# Patient Record
Sex: Female | Born: 1950 | Race: Black or African American | Hispanic: No | Marital: Single | Smoking: Never smoker
Health system: Southern US, Community
[De-identification: ages and names within clinical notes are randomized; demographics above are authoritative.]

## PROBLEM LIST (undated history)

## (undated) DIAGNOSIS — I10 Essential (primary) hypertension: Secondary | ICD-10-CM

---

## 1999-08-07 ENCOUNTER — Encounter: Payer: Self-pay | Admitting: Emergency Medicine

## 1999-08-07 ENCOUNTER — Emergency Department (HOSPITAL_COMMUNITY): Admission: EM | Admit: 1999-08-07 | Discharge: 1999-08-07 | Payer: Self-pay | Admitting: Emergency Medicine

## 1999-11-07 ENCOUNTER — Emergency Department (HOSPITAL_COMMUNITY): Admission: EM | Admit: 1999-11-07 | Discharge: 1999-11-07 | Payer: Self-pay | Admitting: Emergency Medicine

## 2007-03-13 ENCOUNTER — Emergency Department (HOSPITAL_COMMUNITY): Admission: EM | Admit: 2007-03-13 | Discharge: 2007-03-13 | Payer: Self-pay | Admitting: Emergency Medicine

## 2008-10-08 ENCOUNTER — Emergency Department (HOSPITAL_COMMUNITY): Admission: EM | Admit: 2008-10-08 | Discharge: 2008-10-08 | Payer: Self-pay | Admitting: Emergency Medicine

## 2008-10-11 ENCOUNTER — Emergency Department (HOSPITAL_COMMUNITY): Admission: EM | Admit: 2008-10-11 | Discharge: 2008-10-11 | Payer: Self-pay | Admitting: Emergency Medicine

## 2009-07-05 ENCOUNTER — Emergency Department (HOSPITAL_COMMUNITY): Admission: EM | Admit: 2009-07-05 | Discharge: 2009-07-05 | Payer: Self-pay | Admitting: Emergency Medicine

## 2011-07-24 LAB — URINALYSIS, ROUTINE W REFLEX MICROSCOPIC
Bilirubin Urine: NEGATIVE
Glucose, UA: NEGATIVE mg/dL
Hgb urine dipstick: NEGATIVE
Ketones, ur: NEGATIVE mg/dL
Nitrite: NEGATIVE
Protein, ur: NEGATIVE mg/dL
Specific Gravity, Urine: 1.021 (ref 1.005–1.030)
Urobilinogen, UA: 1 mg/dL (ref 0.0–1.0)
pH: 7.5 (ref 5.0–8.0)

## 2011-07-24 LAB — URINE MICROSCOPIC-ADD ON

## 2011-07-24 LAB — DIFFERENTIAL
Eosinophils Absolute: 0 10*3/uL (ref 0.0–0.7)
Eosinophils Relative: 2 % (ref 0–5)
Lymphocytes Relative: 28 % (ref 12–46)
Lymphs Abs: 0.7 10*3/uL (ref 0.7–4.0)
Lymphs Abs: 1.5 10*3/uL (ref 0.7–4.0)
Monocytes Absolute: 0.3 10*3/uL (ref 0.1–1.0)
Monocytes Relative: 4 % (ref 3–12)
Neutrophils Relative %: 61 % (ref 43–77)
Neutrophils Relative %: 87 % — ABNORMAL HIGH (ref 43–77)

## 2011-07-24 LAB — BASIC METABOLIC PANEL
BUN: 8 mg/dL (ref 6–23)
CO2: 24 mEq/L (ref 19–32)
Calcium: 9.1 mg/dL (ref 8.4–10.5)
Chloride: 104 mEq/L (ref 96–112)
Creatinine, Ser: 0.99 mg/dL (ref 0.4–1.2)
GFR calc Af Amer: >60 mL/min (ref >60)
GFR calc non Af Amer: 58 mL/min — ABNORMAL LOW (ref >60)
Glucose, Bld: 118 mg/dL — ABNORMAL HIGH (ref 70–99)
Potassium: 3.4 mEq/L — ABNORMAL LOW (ref 3.5–5.1)
Sodium: 138 mEq/L (ref 135–145)

## 2011-07-24 LAB — POCT I-STAT, CHEM 8
BUN: 10 mg/dL (ref 6–23)
Creatinine, Ser: 1 mg/dL (ref 0.4–1.2)
Potassium: 2.9 mEq/L — ABNORMAL LOW (ref 3.5–5.1)
Sodium: 143 mEq/L (ref 135–145)

## 2011-07-24 LAB — CBC
HCT: 36.1 % (ref 36.0–46.0)
HCT: 38.5 % (ref 36.0–46.0)
Hemoglobin: 12.8 g/dL (ref 12.0–15.0)
MCHC: 33.2 g/dL (ref 30.0–36.0)
MCV: 84.1 fL (ref 78.0–100.0)
Platelets: 212 10*3/uL (ref 150–400)
Platelets: 214 10*3/uL (ref 150–400)
RBC: 4.33 MIL/uL (ref 3.87–5.11)
RBC: 4.58 MIL/uL (ref 3.87–5.11)
RDW: 14 % (ref 11.5–15.5)
WBC: 5.2 10*3/uL (ref 4.0–10.5)
WBC: 8.2 10*3/uL (ref 4.0–10.5)

## 2011-07-24 LAB — URINE CULTURE

## 2011-10-15 ENCOUNTER — Emergency Department (HOSPITAL_COMMUNITY)
Admission: EM | Admit: 2011-10-15 | Discharge: 2011-10-15 | Disposition: A | Discharge status: Home / Self Care | Payer: Self-pay | Attending: Emergency Medicine | Admitting: Emergency Medicine

## 2011-10-15 ENCOUNTER — Emergency Department (HOSPITAL_COMMUNITY): Payer: Self-pay

## 2011-10-15 ENCOUNTER — Encounter: Payer: Self-pay | Admitting: *Deleted

## 2011-10-15 DIAGNOSIS — H8110 Benign paroxysmal vertigo, unspecified ear: Secondary | ICD-10-CM | POA: Insufficient documentation

## 2011-10-15 DIAGNOSIS — R42 Dizziness and giddiness: Secondary | ICD-10-CM | POA: Insufficient documentation

## 2011-10-15 DIAGNOSIS — F411 Generalized anxiety disorder: Secondary | ICD-10-CM | POA: Insufficient documentation

## 2011-10-15 DIAGNOSIS — R112 Nausea with vomiting, unspecified: Secondary | ICD-10-CM | POA: Insufficient documentation

## 2011-10-15 DIAGNOSIS — J3489 Other specified disorders of nose and nasal sinuses: Secondary | ICD-10-CM | POA: Insufficient documentation

## 2011-10-15 HISTORY — DX: Essential (primary) hypertension: I10

## 2011-10-15 LAB — CBC
Hemoglobin: 13.6 g/dL (ref 12.0–15.0)
MCH: 28.5 pg (ref 26.0–34.0)
MCV: 83 fL (ref 78.0–100.0)
RBC: 4.77 MIL/uL (ref 3.87–5.11)

## 2011-10-15 LAB — DIFFERENTIAL
Eosinophils Absolute: 0 10*3/uL (ref 0.0–0.7)
Eosinophils Relative: 1 % (ref 0–5)
Lymphs Abs: 2.1 10*3/uL (ref 0.7–4.0)
Monocytes Relative: 6 % (ref 3–12)

## 2011-10-15 LAB — URINALYSIS, ROUTINE W REFLEX MICROSCOPIC
Nitrite: NEGATIVE
Urobilinogen, UA: 0.2 mg/dL (ref 0.0–1.0)
pH: 8 (ref 5.0–8.0)

## 2011-10-15 LAB — COMPREHENSIVE METABOLIC PANEL
Alkaline Phosphatase: 79 U/L (ref 39–117)
BUN: 13 mg/dL (ref 6–23)
Calcium: 10.2 mg/dL (ref 8.4–10.5)
GFR calc Af Amer: 83 mL/min — ABNORMAL LOW (ref >90)
Glucose, Bld: 116 mg/dL — ABNORMAL HIGH (ref 70–99)
Total Protein: 8.6 g/dL — ABNORMAL HIGH (ref 6.0–8.3)

## 2011-10-15 MED ORDER — MECLIZINE HCL 25 MG PO TABS
25.0000 mg | ORAL_TABLET | Freq: Once | ORAL | Status: AC
  Administered 2011-10-15: 25 mg via ORAL
  Filled 2011-10-15: qty 1

## 2011-10-15 MED ORDER — MECLIZINE HCL 25 MG PO TABS: 25.0000 mg | ORAL_TABLET | Freq: Four times a day (QID) | ORAL | Status: AC

## 2011-10-15 MED ORDER — OXYMETAZOLINE HCL 0.05 % NA SOLN: 2.0000 | Freq: Two times a day (BID) | NASAL | Status: AC

## 2011-10-15 MED ORDER — SODIUM CHLORIDE 0.9 % IV BOLUS (SEPSIS)
1000.0000 mL | Freq: Once | INTRAVENOUS | Status: AC
  Administered 2011-10-15: 1000 mL via INTRAVENOUS

## 2011-10-15 NOTE — ED Provider Notes (Signed)
History     CSN: [id number]  Arrival date & time 10/15/11  1613   First MD Initiated Contact with Patient 10/15/11 1700      Chief Complaint  Patient presents with  . Influenza    (Consider location/radiation/quality/duration/timing/severity/associated sxs/prior treatment) HPI History is obtained from the patient. States she has had flulike symptoms for several days approx 10 days ago. She has not had a fever at home. States that her flulike symptoms have improved, but she has had dizziness, nausea, and emesis for the past several days. She has been unable to tolerate PO due to nausea.  She is unable to describe the sensation of dizziness well, but thinks it feels most consistent with a sensation of the room spinning. She has had no associated ear pain or tinnitus.  She notes that the dizziness does seem to get worse with certain position changes. She's never had anything like this before.  Past Medical History  Diagnosis Date  . Hypertension     History reviewed. No pertinent past surgical history.  No family history on file.  History  Substance Use Topics  . Smoking status: Never Smoker   . Smokeless tobacco: Not on file  . Alcohol Use: No    OB History    Grav Para Term Preterm Abortions TAB SAB Ect Mult Living                  Review of Systems  Constitutional: Positive for appetite change. Negative for fever, chills, activity change, fatigue and unexpected weight change.  HENT: Positive for congestion. Negative for hearing loss, ear pain, sore throat, trouble swallowing, neck pain, neck stiffness, voice change, sinus pressure and tinnitus.   Eyes: Negative for pain, discharge, redness and visual disturbance.  Respiratory: Negative for cough, chest tightness, shortness of breath and wheezing.   Cardiovascular: Negative for chest pain and palpitations.  Gastrointestinal: Positive for nausea and vomiting. Negative for abdominal pain, diarrhea and constipation.    Genitourinary: Negative for dysuria and decreased urine volume.  Musculoskeletal: Negative for myalgias.  Skin: Negative for color change and rash.  Neurological: Positive for dizziness. Negative for syncope, weakness, light-headedness, numbness and headaches.    Allergies  Review of patient's allergies indicates no known allergies.  Home Medications   Current Outpatient Rx  Name Route Sig Dispense Refill  . IBUPROFEN 200 MG PO TABS Oral Take 400 mg by mouth every 6 (six) hours as needed. pain       BP 154/101  Pulse 106  Temp(Src) 97.6 F (36.4 C) (Oral)  Resp 24  SpO2 100%  Physical Exam  Nursing note and vitals reviewed. Constitutional: She is oriented to person, place, and time. She appears well-developed and well-nourished. No distress.       Uncomfortable appearing  HENT:  Head: Normocephalic and atraumatic.  Right Ear: Tympanic membrane, external ear and ear canal normal.  Left Ear: Tympanic membrane, external ear and ear canal normal.  Nose: Nose normal.  Mouth/Throat: Oropharynx is clear and moist. No oropharyngeal exudate.       No definitive nystagmus noted on Hallpike testing, although patient's symptoms were replicated when her head is tilted to the right.  Eyes: Conjunctivae and EOM are normal. Pupils are equal, round, and reactive to light.  Neck: Normal range of motion. Neck supple.  Cardiovascular: Normal rate, regular rhythm and normal heart sounds.   Pulmonary/Chest: Effort normal and breath sounds normal. No respiratory distress. She has no wheezes.  Abdominal: Soft. Bowel  sounds are normal. There is no tenderness. There is no guarding.  Musculoskeletal: Normal range of motion.  Lymphadenopathy:    She has no cervical adenopathy.  Neurological: She is alert and oriented to person, place, and time. No cranial nerve deficit. Coordination normal.  Skin: Skin is warm and dry. No rash noted. She is not diaphoretic.  Psychiatric: Her speech is normal. Her  mood appears anxious.    ED Course  Procedures (including critical care time)  Labs Reviewed  COMPREHENSIVE METABOLIC PANEL - Abnormal; Notable for the following:    Glucose, Bld 116 (*)    Total Protein 8.6 (*)    ALT 43 (*)    GFR calc non Af Amer 72 (*)    GFR calc Af Amer 83 (*)    All other components within normal limits  URINALYSIS, ROUTINE W REFLEX MICROSCOPIC - Abnormal; Notable for the following:    Ketones, ur TRACE (*)    Leukocytes, UA SMALL (*)    All other components within normal limits  CBC  DIFFERENTIAL  URINE MICROSCOPIC-ADD ON   Ct Head Wo Contrast  10/15/2011  *RADIOLOGY REPORT*  Clinical Data: Dizziness for 10 days  CT HEAD WITHOUT CONTRAST  Technique:  Contiguous axial images were obtained from the base of the skull through the vertex without contrast.  Comparison: None  Findings: The brain has a normal appearance without evidence for hemorrhage, infarction, hydrocephalus, or mass lesion.  There is no extra axial fluid collection.  The skull and paranasal sinuses are normal.  IMPRESSION:  No acute intracranial abnormalities.  Negative exam.  Original Report Authenticated By: Rosealee Albee, M.D.     1. BPPV (benign paroxysmal positional vertigo)       MDM  A CT was obtained, as the pt's sx were only partially relieved with Antivert. This was negative. Labs unremarkable. She did not have a definitive nystagmus with Hallpike test, although her symptoms were replicated when her head was tilted to the right. Plan to treat as BPPV; pt given rxes for meclizine and Afrin for nasal decongestion. She was encouraged to f/u with ENT if sx persist. Discussed reasons to return to ED. Pt verbalized understanding and agreed to plan.        Grant Fontana, [place] 10/15/11 2119

## 2011-10-15 NOTE — ED Notes (Signed)
Pt reports having flu x10 days. Feels "swimmy-headed", nauseous. Sts not barely eaten while sick.

## 2011-10-15 NOTE — ED Notes (Signed)
Pt. C/o arm cold where IV is. RN notified.

## 2011-10-16 NOTE — ED Provider Notes (Signed)
Medical screening examination/treatment/procedure(s) were performed by non-physician practitioner and as supervising physician I was immediately available for consultation/collaboration.  Medical screening examination/treatment/procedure(s) were performed by non-physician practitioner and as supervising physician I was immediately available for consultation/collaboration.    Nelia Shi, MD 10/16/11 [id number]

## 2014-10-31 ENCOUNTER — Emergency Department (HOSPITAL_COMMUNITY)
Admission: EM | Admit: 2014-10-31 | Discharge: 2014-10-31 | Disposition: A | Discharge status: Home / Self Care | Payer: 59 | Attending: Emergency Medicine | Admitting: Emergency Medicine

## 2014-10-31 ENCOUNTER — Encounter (HOSPITAL_COMMUNITY): Payer: Self-pay

## 2014-10-31 DIAGNOSIS — I10 Essential (primary) hypertension: Secondary | ICD-10-CM | POA: Diagnosis not present

## 2014-10-31 DIAGNOSIS — K029 Dental caries, unspecified: Secondary | ICD-10-CM | POA: Diagnosis not present

## 2014-10-31 DIAGNOSIS — K088 Other specified disorders of teeth and supporting structures: Secondary | ICD-10-CM | POA: Insufficient documentation

## 2014-10-31 DIAGNOSIS — K0889 Other specified disorders of teeth and supporting structures: Secondary | ICD-10-CM

## 2014-10-31 MED ORDER — HYDROCODONE-ACETAMINOPHEN 5-325 MG PO TABS
2.0000 | ORAL_TABLET | Freq: Once | ORAL | Status: AC
  Administered 2014-10-31: 2 via ORAL
  Filled 2014-10-31: qty 2

## 2014-10-31 MED ORDER — HYDROCODONE-ACETAMINOPHEN 5-325 MG PO TABS: 1.0000 | ORAL_TABLET | Freq: Four times a day (QID) | ORAL | Status: DC | PRN

## 2014-10-31 MED ORDER — LIDOCAINE VISCOUS 2 % MT SOLN
15.0000 mL | Freq: Once | OROMUCOSAL | Status: AC
  Administered 2014-10-31: 15 mL via OROMUCOSAL
  Filled 2014-10-31: qty 15

## 2014-10-31 MED ORDER — PENICILLIN V POTASSIUM 500 MG PO TABS: 500.0000 mg | ORAL_TABLET | Freq: Four times a day (QID) | ORAL | Status: AC

## 2014-10-31 NOTE — ED Notes (Signed)
Pt presents with dental pain on the left lower side of her mouth that started yesterday.

## 2014-10-31 NOTE — Discharge Instructions (Signed)
Dental Pain °A tooth ache may be caused by cavities (tooth decay). Cavities expose the nerve of the tooth to air and hot or cold temperatures. It may come from an infection or abscess (also called a boil or furuncle) around your tooth. It is also often caused by dental caries (tooth decay). This causes the pain you are having. °DIAGNOSIS  °Your caregiver can diagnose this problem by exam. °TREATMENT  °· If caused by an infection, it may be treated with medications which kill germs (antibiotics) and pain medications as prescribed by your caregiver. Take medications as directed. °· Only take over-the-counter or prescription medicines for pain, discomfort, or fever as directed by your caregiver. °· Whether the tooth ache today is caused by infection or dental disease, you should see your dentist as soon as possible for further care. °SEEK MEDICAL CARE IF: °The exam and treatment you received today has been provided on an emergency basis only. This is not a substitute for complete medical or dental care. If your problem worsens or new problems (symptoms) appear, and you are unable to meet with your dentist, call or return to this location. °SEEK IMMEDIATE MEDICAL CARE IF:  °· You have a fever. °· You develop redness and swelling of your face, jaw, or neck. °· You are unable to open your mouth. °· You have severe pain uncontrolled by pain medicine. °MAKE SURE YOU:  °· Understand these instructions. °· Will watch your condition. °· Will get help right away if you are not doing well or get worse. °Document Released: 10/05/2005 Document Revised: 12/28/2011 Document Reviewed: 05/23/2008 °ExitCare® Patient Information ©2015 [employer]. This information is not intended to replace advice given to you by your health care provider. Make sure you discuss any questions you have with your health care provider. ° °Emergency Department Resource Guide °1) Find a Doctor and Pay Out of Pocket °Although you won't have to find out who  is covered by your insurance plan, it is a good idea to ask around and get recommendations. You will then need to call the office and see if the doctor you have chosen will accept you as a new patient and what types of options they offer for patients who are self-pay. Some doctors offer discounts or will set up payment plans for their patients who do not have insurance, but you will need to ask so you aren't surprised when you get to your appointment. ° °2) Contact Your Local Health Department °Not all health departments have doctors that can see patients for sick visits, but many do, so it is worth a call to see if yours does. If you don't know where your local health department is, you can check in your phone book. The CDC also has a tool to help you locate your state's health department, and many state websites also have listings of all of their local health departments. ° °3) Find a Walk-in Clinic °If your illness is not likely to be very severe or complicated, you may want to try a walk in clinic. These are popping up all over the country in pharmacies, drugstores, and shopping centers. They're usually staffed by nurse practitioners or physician assistants that have been trained to treat common illnesses and complaints. They're usually fairly quick and inexpensive. However, if you have serious medical issues or chronic medical problems, these are probably not your best option. ° °No Primary Care Doctor: °- Call [facility] at  832-8000 - they can help you locate a primary   care doctor that  accepts your insurance, provides certain services, etc. °- Physician Referral Service- 1-800-533-3463 ° °Chronic Pain Problems: °Organization         Address  Phone   Notes  °[facility]  (336) 297-2271 Patients need to be referred by their primary care doctor.  ° °Medication Assistance: °Organization         Address  Phone   Notes  °[place] [facility] [address]  [address] °[place], [place] [address] (336) 641-8030 --Must be a resident of [place] °-- Must have NO insurance coverage whatsoever (no Medicaid/ Medicare, etc.) °-- The pt. MUST have a primary care doctor that directs their care regularly and follows them in the community °  °[facility]  (866) 331-1348   °[employer]  (888) 892-1162   ° °Agencies that provide inexpensive medical care: °Organization         Address  Phone   Notes  °[facility]  (336) 832-8035   °[facility]    (336) 832-7272   °[facility] [address] °[place], [place] [address] (336) 832-4777   °[facility] [address], °[place] (336) 271-4999   °[facility]    (336) 373-0678   °[facility]    (336) 272-1050   °[facility] ° [address], [place] Phone:  (336) 832-4444, Fax:  (336) 832-4440 Hours of Operation:  9 am - 6 pm, M-F.  Also accepts Medicaid/Medicare and self-pay.  °[facility] ° [address], [place] Phone: (336) 832-3150, Fax: (336) [phone]. Hours of Operation:  8:30 am - 5:30 pm, M-F.  Also accepts Medicaid and self-pay.  °[facility] [place] [address], [place] Phone: (336) 878-6027   °[facility] [address], [place], [place] (336)723-1848, Ext. 123 Mondays & Thursdays: 7-9 AM.  First 15 patients are seen on a first come, first serve basis. °  ° °Medicaid-accepting [place] Providers: ° °Organization         Address  Phone   Notes  °[facility] [address], [place] (336) 641-2100 Also accepts self-pay patients.  °[facility] [address], [place] ° (336) 856-9996   °[facility] [address], [place] (336) 288-8857   °[facility] [address] [place] [address], [place] (336) 299-7000   °Veita Bland [address], [place]  ° (336) 373-1557 Only accepts [place] Access Medicaid patients after they have their name applied to their card.  ° °Self-Pay (no insurance) in [place]: ° °Organization         Address  Phone   Notes  °Sickle Cell Patients, [facility] [address], [place] (336) 832-1970   °[facility] [address], [place] (336) 832-4400   °[facility] [place] ° [address], [place] (336) 992-4800   °[facility]/Dr. Osei-Bonsu ° [address] [place] [address], [place] or [address], [place] (336) 841-8500 Phone number for both [place] and [place] locations is the same.  °[facility] [address], [place] (336) 299-0000   °[facility] [address] [place] [address], [place] or [address] (336) 852-7530 °(336) 878-2260   °  [facility] [address], [place] (336) 350-1642, phone; (336) 294-5005, fax Sees patients 1st and 3rd Saturday of every month.  Must not qualify for public or private insurance (i.e. Medicaid, Medicare, [place] Health Choice, Veterans' Benefits) • Household income should be no more than 200% of the poverty level •The clinic cannot treat you if you are pregnant or think you are pregnant • Sexually transmitted diseases are not treated at the clinic.  ° ° °Dental Care: °Organization         Address  Phone  Notes  °[facility] [facility] [address], [place] (336) 641-6152 Accepts children up to age 21 who are enrolled in Medicaid or [place] Health Choice; pregnant women with a Medicaid card; and children who have applied for Medicaid or [place] Health Choice, but were declined, whose parents can pay a reduced fee at time of service.  °[facility] [place]  [address], [place] (336) 641-7733 Accepts children up to age 21 who are  enrolled in Medicaid or [place] Health Choice; pregnant women with a Medicaid card; and children who have applied for Medicaid or [place] Health Choice, but were declined, whose parents can pay a reduced fee at time of service.  °[facility] ° [address], [place] (336) 641-4533 Patients are seen by appointment only. Walk-ins are not accepted. [facility] will see patients 18 years of age and older. °Monday - Tuesday (8am-5pm) °Most Wednesdays (8:30-5pm) °$30 per visit, cash only  °[facility] ° [address], [place] (336) 641-4533 Patients are seen by appointment only. Walk-ins are not accepted. [facility] will see patients 18 years of age and older. °One Wednesday Evening (Monthly: Volunteer Based).  $30 per visit, cash only  °[facility]  (919) 537-3737 for adults; Children under age 4, call Graduate Pediatric Dentistry at ([phone]) [phone]. Children aged 4-14, please call (919) [phone] to request a pediatric application. ° Dental services are provided in all areas of dental care including fillings, crowns and bridges, complete and partial dentures, implants, gum treatment, root canals, and extractions. Preventive care is also provided. Treatment is provided to both adults and children. °Patients are selected via a lottery and there is often a waiting list. °  °[facility] [address], °[place] ° (336) 763-8833 [email or web address] °  °[facility] [address], [place], [place] (336)723-1848, Ext. 123 Second and Fourth Thursday of each month, opens at 6:30 AM; Clinic ends at 9 AM.  Patients are seen on a first-come first-served basis, and a limited number are seen during each clinic.  ° °[facility] ° [address], [place], [place] (336) 723-7904   Eligibility Requirements °You must have lived in [place], [place], or [place] counties for at least the last three months. °  You  cannot be eligible for state or federal sponsored healthcare insurance, including Veterans Administration, Medicaid, or Medicare. °  You generally cannot be eligible for healthcare insurance through your employer.  °  How to apply: °Eligibility screenings are held every Tuesday and Wednesday afternoon from 1:00 pm until 4:00 pm. You do not need an appointment for the interview!  °[facility] [address], [place], [place] 336-631-2330   °[facility]  336-342-8273   °[facility]  336-703-3100   °[facility]  336-570-6415   ° °Behavioral Health Resources in the Community: °Intensive Outpatient Programs °Organization         Address  Phone  Notes  °[facility] [address], [place], [place] 336-878-6098   °[facility] [address], [place], [place] 336-832-9800   °[facility] [address], [place], [place] ° 336-882-2125   °[facility] [address],  °[place], [place] 1-800-853-5163 or 336-641-4981   °Substance Abuse Resources °Organization         Address  Phone  Notes  °[facility]  336-882-2125   °[facility]  336-784-9470   °[facility]  336-285-9073   °[facility]  336-845-3988   °Residential & Outpatient Substance Abuse Program  1-800-659-3381   °Psychological Services °Organization         Address  Phone  Notes  ° Health  336- 832-9600   °[facility]  336- 378-7881   °[facility] [address], [place] 1-800-853-5163 or 336-641-4981   ° °Mobile Crisis Teams °Organization         Address  Phone  Notes  °[facility], Mobile Crisis Care Unit  1-877-626-1772   °Assertive °[facility] ° [address] [place], [place] 336-834-9664   °Sharon DeEsch [address] °[place] 336-554-5454   ° °Self-Help/Support  Groups °Organization         Address  Phone             Notes  °[facility] [place] - variety of support groups  336- 373-1402 Call for more information  °Narcotics Anonymous (NA), [facility] [address], °[place]  2 meetings at this location  ° °Residential Treatment Programs °Organization         Address  Phone  Notes  °[facility] [address],    °[place]  1-866-801-8205   °[facility] ° [address], [place], [place] 704-293-8524   °[facility] [address], [place] 336-845-3988 Admissions: 8am-3pm M-F  °[facility] [address]    °[place], [place] 336-841-1104   °[facility] [address], [place], [place] 336-379-7146   °[facility] [address]  °[place], [place] 336-285-9073   °[facility] Intensive Outpatient [address], [place], [place] 336-852-3033   °[facility] [address]  °[place], [place] 1-877-615-2722 or 336-784-9470   °[facility] ([facility] [address] [place], [place] 336-227-7417 Accepts Medicaid  °[facility] [address]  ° [place] 1-800-659-3381 Substance Abuse/Addiction Treatment  ° °[place] Behavioral Health Resources °Organization         Address  Phone  Notes  °[facility]  (888) 581-9988   °Julie Brannon, PhD [address] [place], [place]   (336) 349-5553 or (336) 951-0000   °[facility]   [address] °[place], [place] (336) 349-4454   °[facility] [address], [place], [place] (336) 342-8316 Insurance/Medicaid/sponsorship through [facility]  °[facility] [address]                                    [place],  (336) 342-8316 Therapy/tele-psych/case  °[facility]   [address]  ° [place], [place] (336) 349-2233    °Dr. Arfeen  (336) 349-4544   °[facility] [place]  [employer] [place] [facility] 1) [address], [place] °2) [address], [place] °3)  [address], [place] (336) 349-3220 °(336) 342-7768 ° °(336) 342-8140   °[place] Child Abuse Hotline (336) 342-1394 or (336) 342-3537 (After Hours)    ° ° ° °

## 2014-10-31 NOTE — ED Provider Notes (Signed)
CSN: [id number]     Arrival date & time 10/31/14  1539 History  This chart was scribed for non-physician practitioner, Jinny Sanders, PA-C,working with Mirian Mo, MD, by Karle Plumber, ED Scribe. This patient was seen in room WTR5/WTR5 and the patient's care was started at 6:52 PM.  Chief Complaint  Patient presents with  . Dental Pain   Patient is a 64 y.o. female presenting with tooth pain. The history is provided by the patient. No language interpreter was used.  Dental Pain Associated symptoms: no drooling and no fever     HPI Comments:  Erika Case is a 64 y.o. female who presents to the Emergency Department complaining of severe left lower dental pain that began yesterday. She reports having several teeth on the upper and lower left side that need to be extracted. She reports associated left sided facial swelling. Pt states she has not been [place] to eat due to the pain. She has been taking Ibuprofen with no significant relief of her pain. Touching the area makes the pain worse. Denies alleviating factors. Denies fever, chills, inability to swallow, drooling, nausea or vomiting. She does not currently have a dentist. PMH of HTN.  Past Medical History  Diagnosis Date  . Hypertension    History reviewed. No pertinent past surgical history. No family history on file. History  Substance Use Topics  . Smoking status: Never Smoker   . Smokeless tobacco: Not on file  . Alcohol Use: No   OB History    No data available     Review of Systems  Constitutional: Negative for fever and chills.  HENT: Positive for dental problem. Negative for drooling and trouble swallowing.   Gastrointestinal: Negative for nausea and vomiting.  All other systems reviewed and are negative.   Allergies  Review of patient's allergies indicates no known allergies.  Home Medications   Prior to Admission medications   Medication Sig Start Date End Date Taking? Authorizing Provider   HYDROcodone-acetaminophen (NORCO/VICODIN) 5-325 MG per tablet Take 1-2 tablets by mouth every 6 (six) hours as needed for moderate pain or severe pain. 10/31/14   Monte Fantasia, PA-C  ibuprofen (ADVIL,MOTRIN) 200 MG tablet Take 400 mg by mouth every 6 (six) hours as needed. pain     Historical Provider, MD  penicillin v potassium (VEETID) 500 MG tablet Take 1 tablet (500 mg total) by mouth 4 (four) times daily. 10/31/14 11/07/14  Monte Fantasia, PA-C   Triage Vitals: BP 131/89 mmHg  Pulse 90  Temp(Src) 98.1 F (36.7 C) (Oral)  Resp 18  SpO2 99% Physical Exam  Constitutional: She is oriented to person, place, and time. She appears well-developed and well-nourished.  HENT:  Head: Normocephalic and atraumatic.  Mouth/Throat: Uvula is midline, oropharynx is clear and moist and mucous membranes are normal. No trismus in the jaw. Abnormal dentition. Dental caries present. No dental abscesses.  Dental carries diffusely with a fractured lower left canine tooth. Mouth floor soft. No signs of induration or cellulitis. No gross abscess.  Eyes: EOM are normal.  Neck: Normal range of motion.  Cardiovascular: Normal rate.   Pulmonary/Chest: Effort normal.  Musculoskeletal: Normal range of motion.  Neurological: She is alert and oriented to person, place, and time.  Skin: Skin is warm and dry.  Psychiatric: She has a normal mood and affect. Her behavior is normal.  Nursing note and vitals reviewed.   ED Course  Procedures (including critical care time) DIAGNOSTIC STUDIES: Oxygen Saturation is 99% on  RA, normal by my interpretation.   COORDINATION OF CARE: 6:55 PM- Will prescribe antibiotics and pain medication. Will give dental referral. Pt verbalizes understanding and agrees to plan.  Medications  lidocaine (XYLOCAINE) 2 % viscous mouth solution 15 mL (15 mLs Mouth/Throat Given 10/31/14 1849)  HYDROcodone-acetaminophen (NORCO/VICODIN) 5-325 MG per tablet 2 tablet (2 tablets Oral Given 10/31/14  1900)    Labs Review Labs Reviewed - No data to display  Imaging Review No results found.   EKG Interpretation None      MDM   Final diagnoses:  Pain, dental    Patient with toothache.  No gross abscess.  Exam unconcerning for Ludwig's angina or spread of infection.  Will treat with penicillin and pain medicine.  Urged patient to follow-up with dentist.  I discussed specific return precautions with patient, and patient verbalizes understanding and agreement of this plan. I encouraged patient to call or return to the ER should she have any questions or concerns.  I personally performed the services described in this documentation, which was scribed in my presence. The recorded information has been reviewed and is accurate.  BP 125/89 mmHg  Pulse 85  Temp(Src) 98.1 F (36.7 C) (Oral)  Resp 18  SpO2 99%  Signed,  Ladona Mow, PA-C 8:16 PM   Monte Fantasia, PA-C 10/31/14 2016  Mirian Mo, MD 11/02/14 [phone]

## 2014-11-01 ENCOUNTER — Encounter (HOSPITAL_COMMUNITY): Payer: Self-pay | Admitting: Emergency Medicine

## 2014-11-01 ENCOUNTER — Emergency Department (HOSPITAL_COMMUNITY)
Admission: EM | Admit: 2014-11-01 | Discharge: 2014-11-01 | Disposition: A | Discharge status: Home / Self Care | Payer: 59 | Attending: Emergency Medicine | Admitting: Emergency Medicine

## 2014-11-01 DIAGNOSIS — I10 Essential (primary) hypertension: Secondary | ICD-10-CM | POA: Insufficient documentation

## 2014-11-01 DIAGNOSIS — T40605A Adverse effect of unspecified narcotics, initial encounter: Secondary | ICD-10-CM | POA: Insufficient documentation

## 2014-11-01 DIAGNOSIS — T50905A Adverse effect of unspecified drugs, medicaments and biological substances, initial encounter: Secondary | ICD-10-CM

## 2014-11-01 DIAGNOSIS — R112 Nausea with vomiting, unspecified: Secondary | ICD-10-CM | POA: Insufficient documentation

## 2014-11-01 DIAGNOSIS — R109 Unspecified abdominal pain: Secondary | ICD-10-CM | POA: Diagnosis present

## 2014-11-01 LAB — CBC WITH DIFFERENTIAL/PLATELET
BASOS ABS: 0 10*3/uL (ref 0.0–0.1)
Basophils Relative: 0 % (ref 0–1)
Eosinophils Absolute: 0.1 10*3/uL (ref 0.0–0.7)
Eosinophils Relative: 1 % (ref 0–5)
HEMATOCRIT: 39 % (ref 36.0–46.0)
HEMOGLOBIN: 13.1 g/dL (ref 12.0–15.0)
LYMPHS PCT: 21 % (ref 12–46)
Lymphs Abs: 1.8 10*3/uL (ref 0.7–4.0)
MCH: 28.5 pg (ref 26.0–34.0)
MCHC: 33.6 g/dL (ref 30.0–36.0)
MCV: 84.8 fL (ref 78.0–100.0)
MONOS PCT: 5 % (ref 3–12)
Monocytes Absolute: 0.4 10*3/uL (ref 0.1–1.0)
NEUTROS PCT: 73 % (ref 43–77)
Neutro Abs: 6.5 10*3/uL (ref 1.7–7.7)
PLATELETS: 217 10*3/uL (ref 150–400)
RBC: 4.6 MIL/uL (ref 3.87–5.11)
RDW: 13.2 % (ref 11.5–15.5)
WBC: 8.8 10*3/uL (ref 4.0–10.5)

## 2014-11-01 LAB — COMPREHENSIVE METABOLIC PANEL
ALT: 18 U/L (ref 0–35)
AST: 26 U/L (ref 0–37)
Albumin: 4.3 g/dL (ref 3.5–5.2)
Alkaline Phosphatase: 80 U/L (ref 39–117)
Anion gap: 10 (ref 5–15)
BILIRUBIN TOTAL: 0.6 mg/dL (ref 0.3–1.2)
BUN: 11 mg/dL (ref 6–23)
CALCIUM: 9.5 mg/dL (ref 8.4–10.5)
CO2: 23 mmol/L (ref 19–32)
Chloride: 104 mEq/L (ref 96–112)
Creatinine, Ser: 1.05 mg/dL (ref 0.50–1.10)
GFR calc non Af Amer: 55 mL/min — ABNORMAL LOW (ref >90)
GFR, EST AFRICAN AMERICAN: 64 mL/min — AB (ref >90)
Glucose, Bld: 141 mg/dL — ABNORMAL HIGH (ref 70–99)
POTASSIUM: 3.5 mmol/L (ref 3.5–5.1)
Sodium: 137 mmol/L (ref 135–145)
Total Protein: 8.3 g/dL (ref 6.0–8.3)

## 2014-11-01 LAB — URINALYSIS, ROUTINE W REFLEX MICROSCOPIC
BILIRUBIN URINE: NEGATIVE
Glucose, UA: NEGATIVE mg/dL
Hgb urine dipstick: NEGATIVE
KETONES UR: NEGATIVE mg/dL
LEUKOCYTES UA: NEGATIVE
NITRITE: NEGATIVE
PH: 7 (ref 5.0–8.0)
PROTEIN: NEGATIVE mg/dL
SPECIFIC GRAVITY, URINE: 1.019 (ref 1.005–1.030)
Urobilinogen, UA: 0.2 mg/dL (ref 0.0–1.0)

## 2014-11-01 LAB — LIPASE, BLOOD: LIPASE: 47 U/L (ref 11–59)

## 2014-11-01 MED ORDER — ONDANSETRON HCL 4 MG/2ML IJ SOLN
4.0000 mg | Freq: Once | INTRAMUSCULAR | Status: AC
  Administered 2014-11-01: 4 mg via INTRAVENOUS
  Filled 2014-11-01: qty 2

## 2014-11-01 MED ORDER — TRAMADOL HCL 50 MG PO TABS
50.0000 mg | ORAL_TABLET | Freq: Once | ORAL | Status: AC
  Administered 2014-11-01: 50 mg via ORAL
  Filled 2014-11-01: qty 1

## 2014-11-01 MED ORDER — TRAMADOL HCL 50 MG PO TABS: 50.0000 mg | ORAL_TABLET | Freq: Four times a day (QID) | ORAL | Status: DC | PRN

## 2014-11-01 NOTE — Discharge Instructions (Signed)

## 2014-11-01 NOTE — ED Notes (Signed)
Pt ambulated to the BR with steady gait.   

## 2014-11-01 NOTE — ED Notes (Signed)
Pt very upset at the prospect of waiting in the lobby.  Pt states "I was here last night! I need a bed! I'm dying!".  Pt gently reminded that we have drawn labwork and did an EKG to ensure that she was not in danger of imminent death to the best of our knowledge.  Pt given a blanket and assisted into wheelchair that she requested, given an emesis bag, and wheeled into lobby.

## 2014-11-01 NOTE — ED Notes (Signed)
Pt ambulated independently, with no assistance and steady gait to bathroom and back.

## 2014-11-01 NOTE — ED Notes (Signed)
Pt attempted to provide urine sample, states she was unable to.

## 2014-11-01 NOTE — ED Notes (Addendum)
Pt c/o dizziness, headache, nausea, abdominal pain after taking prescribed penicillin and norco, which she was prescribed yesterday in ED for dental pain. Pt is wailing, stating she feels like she might faint.

## 2014-11-01 NOTE — ED Notes (Signed)
Patient verbalizes understanding of discharge instructions, prescription medications, home care and follow up care. Patient ambulatory out of department at this time with friend 

## 2014-11-01 NOTE — ED Notes (Addendum)
Pt reports being seen here for dental pain yesterday, started to have n/v after taking rxs PCN and vicodin for pain.  Pt reports her stomach hurts from vomiting.  Denies diarrhea at this time and is unable to eat or drink d/t n/v

## 2014-11-01 NOTE — ED Provider Notes (Signed)
CSN: [id number]     Arrival date & time 11/01/14  1416 History   First MD Initiated Contact with Patient 11/01/14 1620     Chief Complaint  Patient presents with  . Abdominal Pain  . Headache     (Consider location/radiation/quality/duration/timing/severity/associated sxs/prior Treatment) Patient is a 64 y.o. female presenting with vomiting.  Emesis Severity:  Severe Duration:  1 day Timing:  Constant Emesis appearance: yellow. Progression:  Unchanged Chronicity:  New Context comment:  Seen in ED yesterday and given PCN and norco for a toothache. Her symptoms worsen after norco.  Relieved by:  Nothing Exacerbated by: Norco. Ineffective treatments:  None tried Associated symptoms: headaches ("swimmy headed")   Associated symptoms: no abdominal pain     Past Medical History  Diagnosis Date  . Hypertension    History reviewed. No pertinent past surgical history. History reviewed. No pertinent family history. History  Substance Use Topics  . Smoking status: Never Smoker   . Smokeless tobacco: Not on file  . Alcohol Use: No   OB History    No data available     Review of Systems  Gastrointestinal: Positive for vomiting. Negative for abdominal pain.  Neurological: Positive for headaches ("swimmy headed").  All other systems reviewed and are negative.     Allergies  Review of patient's allergies indicates no known allergies.  Home Medications   Prior to Admission medications   Medication Sig Start Date End Date Taking? Authorizing Provider  HYDROcodone-acetaminophen (NORCO/VICODIN) 5-325 MG per tablet Take 1-2 tablets by mouth every 6 (six) hours as needed for moderate pain or severe pain. 10/31/14  Yes Monte Fantasia, PA-C  ibuprofen (ADVIL,MOTRIN) 200 MG tablet Take 400 mg by mouth every 6 (six) hours as needed. pain    Yes Historical Provider, MD  penicillin v potassium (VEETID) 500 MG tablet Take 1 tablet (500 mg total) by mouth 4 (four) times daily. 10/31/14  11/07/14 Yes Monte Fantasia, PA-C   BP 159/98 mmHg  Pulse 100  Temp(Src) 97.9 F (36.6 C) (Oral)  Resp 18  SpO2 100% Physical Exam  Constitutional: She is oriented to person, place, and time. She appears well-developed and well-nourished. No distress.  HENT:  Head: Normocephalic and atraumatic.  Mouth/Throat: Oropharynx is clear and moist.  Eyes: Conjunctivae are normal. Pupils are equal, round, and reactive to light. No scleral icterus.  Neck: Neck supple.  Cardiovascular: Normal rate, regular rhythm, normal heart sounds and intact distal pulses.   No murmur heard. Pulmonary/Chest: Effort normal and breath sounds normal. No stridor. No respiratory distress. She has no rales.  Abdominal: Soft. Bowel sounds are normal. She exhibits no distension. There is no tenderness. There is no rebound and no guarding.  Musculoskeletal: Normal range of motion.  Neurological: She is alert and oriented to person, place, and time.  Skin: Skin is warm and dry. No rash noted.  Psychiatric: She has a normal mood and affect. Her behavior is normal.  Nursing note and vitals reviewed.   ED Course  Procedures (including critical care time) Labs Review Labs Reviewed  COMPREHENSIVE METABOLIC PANEL - Abnormal; Notable for the following:    Glucose, Bld 141 (*)    GFR calc non Af Amer 55 (*)    GFR calc Af Amer 64 (*)    All other components within normal limits  CBC WITH DIFFERENTIAL  LIPASE, BLOOD  URINALYSIS, ROUTINE W REFLEX MICROSCOPIC  I-STAT TROPOININ, ED    Imaging Review No results found.   EKG  Interpretation   Date/Time:  Thursday November 01 2014 14:31:53 EST Ventricular Rate:  98 PR Interval:  156 QRS Duration: 86 QT Interval:  359 QTC Calculation: 458 R Axis:   76 Text Interpretation:  Sinus rhythm Consider right atrial enlargement No  old tracing to compare Confirmed by [facility]  MD, TREY ([id number]) on 11/01/2014  5:15:04 PM      MDM   Final diagnoses:  Medication adverse  effect, initial encounter  Non-intractable vomiting with nausea, vomiting of unspecified type    64 yo female with nausea, vomiting, and dizziness in the context of taking Norco.  She is convinced it is this medication, not the penicillin she is also taking.  She has had similar reactions to pain medicine in the past. Labs are unremarkable.  By history, her presentation is consistent with a medication adverse effect.    On recheck, she stated nausea and dizziness were resolved.  Now she complains only of her tooth pain.  Will try tramadol.  If still has side effects, will recommend ibuprofen.  She appears stable for DC home.    Merrie Roof, MD 11/01/14 2128

## 2015-04-01 ENCOUNTER — Emergency Department (HOSPITAL_COMMUNITY)
Admission: EM | Admit: 2015-04-01 | Discharge: 2015-04-01 | Disposition: A | Discharge status: Home / Self Care | Payer: 59 | Attending: Emergency Medicine | Admitting: Emergency Medicine

## 2015-04-01 ENCOUNTER — Encounter (HOSPITAL_COMMUNITY): Payer: Self-pay | Admitting: Emergency Medicine

## 2015-04-01 DIAGNOSIS — K120 Recurrent oral aphthae: Secondary | ICD-10-CM | POA: Diagnosis not present

## 2015-04-01 DIAGNOSIS — I10 Essential (primary) hypertension: Secondary | ICD-10-CM | POA: Diagnosis not present

## 2015-04-01 DIAGNOSIS — J029 Acute pharyngitis, unspecified: Secondary | ICD-10-CM | POA: Diagnosis present

## 2015-04-01 MED ORDER — TRAMADOL HCL 50 MG PO TABS: 50.0000 mg | ORAL_TABLET | Freq: Four times a day (QID) | ORAL | Status: DC | PRN

## 2015-04-01 MED ORDER — MAGIC MOUTHWASH W/LIDOCAINE: 5.0000 mL | Freq: Four times a day (QID) | ORAL | Status: DC | PRN

## 2015-04-01 NOTE — ED Notes (Signed)
Pt c/o sore throat x 2 days, painful to swallow. Denies cough or congestion.

## 2015-04-01 NOTE — Discharge Instructions (Signed)
Oral Ulcers Oral ulcers are painful, shallow sores around the lining of the mouth. They can affect the gums, the inside of the lips, and the cheeks. (Sores on the outside of the lips and on the face are different.) They typically first occur in school-aged children and teenagers. Oral ulcers may also be called canker sores or cold sores. CAUSES  Canker sores and cold sores can be caused by many factors including:  Infection.  Injury.  Sun exposure.  Medications.  Emotional stress.  Food allergies.  Vitamin deficiencies.  Toothpastes containing sodium lauryl sulfate. The herpes virus can be the cause of mouth ulcers. The first infection can be severe and cause 10 or more ulcers on the gums, tongue, and lips with fever and difficulty in swallowing. This infection usually occurs between the ages of 1 and 3 years.  SYMPTOMS  The typical sore is about  inch (6 mm) in size and is an oval or round ulcer with red borders. DIAGNOSIS  Your caregiver can diagnose simple oral ulcers by examination. Additional testing is usually not required.  TREATMENT  Treatment is aimed at pain relief. Generally, oral ulcers resolve by themselves within 1 to 2 weeks without medication and are not contagious unless caused by herpes (and other viruses). Antibiotics are not effective with mouth sores. Avoid direct contact with others until the ulcer is completely healed. See your caregiver for follow-up care as recommended. Also:  Offer a soft diet.  Encourage plenty of fluids to prevent dehydration. Popsicles and milk shakes can be helpful.  Avoid acidic and salty foods and drinks such as orange juice.  Infants and young children will often refuse to drink because of pain. Using a teaspoon, cup, or syringe to give small amounts of fluids frequently can help prevent dehydration.  Cold compresses on the face may help reduce pain.  Pain medication can help control soreness.  A solution of diphenhydramine  mixed with a liquid antacid can be useful to decrease the soreness of ulcers. Consult a caregiver for the dosing.  Liquids or ointments with a numbing ingredient may be helpful when used as recommended.  Older children and teenagers can rinse their mouth with a salt-water mixture (1/2 teaspoon of salt in 8 ounces of water) four times a day. This treatment is uncomfortable but may reduce the time the ulcers are present.  There are many over-the-counter throat lozenges and medications available for oral ulcers. Their effectiveness has not been studied.  Consult your medical caregiver prior to using homeopathic treatments for oral ulcers. SEEK MEDICAL CARE IF:   You think your child needs to be seen.  The pain worsens and you cannot control it.  There are 4 or more ulcers.  The lips and gums begin to bleed and crust.  A single mouth ulcer is near a tooth that is causing a toothache or pain.  Your child has a fever, swollen face, or swollen glands.  The ulcers began after starting a medication.  Mouth ulcers keep reoccurring or last more than 2 weeks.  You think your child is not taking adequate fluids. SEEK IMMEDIATE MEDICAL CARE IF:   Your child has a high fever.  Your child is unable to swallow or becomes dehydrated.  Your child looks or acts very ill.  An ulcer caused by a chemical your child accidentally put in their mouth. Document Released: 11/12/2004 Document Revised: 02/19/2014 Document Reviewed: 06/27/2009 Exit[employer] Patient Information 2015 [employer]. This information is not intended to replace advice   given to you by your health care provider. Make sure you discuss any questions you have with your health care provider.  

## 2015-04-01 NOTE — ED Provider Notes (Signed)
CSN: [id number]     Arrival date & time 04/01/15  5102 History   First MD Initiated Contact with Patient 04/01/15 0857     Chief Complaint  Patient presents with  . Sore Throat     (Consider location/radiation/quality/duration/timing/severity/associated sxs/prior Treatment) HPI Comments: Erika Case to the ER for evaluation of pain in her mouth and throat. Symptoms began 2 days ago. She has not had any fever, nasal congestion, cough or other cold symptoms. Patient reports pain on the right side buccal mucosa that worsens when she tries to swallow. Pain is moderate to severe.  Patient is a 64 y.o. female presenting with pharyngitis.  Sore Throat    Past Medical History  Diagnosis Date  . Hypertension    History reviewed. No pertinent past surgical history. History reviewed. No pertinent family history. History  Substance Use Topics  . Smoking status: Never Smoker   . Smokeless tobacco: Not on file  . Alcohol Use: No   OB History    No data available     Review of Systems  HENT: Positive for mouth sores.   All other systems reviewed and are negative.     Allergies  Review of patient's allergies indicates no known allergies.  Home Medications   Prior to Admission medications   Medication Sig Start Date End Date Taking? Authorizing Provider  ibuprofen (ADVIL,MOTRIN) 200 MG tablet Take 400 mg by mouth every 6 (six) hours as needed. pain    Yes Historical Provider, MD  HYDROcodone-acetaminophen (NORCO/VICODIN) 5-325 MG per tablet Take 1-2 tablets by mouth every 6 (six) hours as needed for moderate pain or severe pain. Patient not taking: Reported on 04/01/2015 10/31/14   Ladona Mow, PA-C  traMADol (ULTRAM) 50 MG tablet Take 1 tablet (50 mg total) by mouth every 6 (six) hours as needed. Patient not taking: Reported on 04/01/2015 11/01/14   Blake Divine, MD   BP 143/86 mmHg  Pulse 87  Temp(Src) 98 F (36.7 C) (Oral)  Resp 18  SpO2 98% Physical Exam  Constitutional: She is  oriented to person, place, and time. She appears well-developed and well-nourished. No distress.  HENT:  Head: Normocephalic and atraumatic.  Right Ear: Hearing normal.  Left Ear: Hearing normal.  Nose: Nose normal.  Mouth/Throat: Oropharynx is clear and moist and mucous membranes are normal.  Edentulous lower No significant caries or tenderness of upper teeth  Ulcerated region buccal mucosa on right side, lower region of mucosa.  Normal tonsillar region without erythema or exudate  Eyes: Conjunctivae and EOM are normal. Pupils are equal, round, and reactive to light.  Neck: Normal range of motion. Neck supple.  Cardiovascular: Regular rhythm, S1 normal and S2 normal.  Exam reveals no gallop and no friction rub.   No murmur heard. Pulmonary/Chest: Effort normal and breath sounds normal. No respiratory distress. She exhibits no tenderness.  Abdominal: Soft. Normal appearance and bowel sounds are normal. There is no hepatosplenomegaly. There is no tenderness. There is no rebound, no guarding, no tenderness at McBurney's point and negative Murphy's sign. No hernia.  Musculoskeletal: Normal range of motion.  Neurological: She is alert and oriented to person, place, and time. She has normal strength. No cranial nerve deficit or sensory deficit. Coordination normal. GCS eye subscore is 4. GCS verbal subscore is 5. GCS motor subscore is 6.  Skin: Skin is warm, dry and intact. No rash noted. No cyanosis.  Psychiatric: She has a normal mood and affect. Her speech is normal and behavior is normal.  Thought content normal.  Nursing note and vitals reviewed.   ED Course  Procedures (including critical care time) Labs Review Labs Reviewed - No data to display  Imaging Review No results found.   EKG Interpretation None      MDM   Final diagnoses:  None    Stomatitis  Patient presents to the ER for evaluation of mouth pain. She has ulcer of the buccal mucosa causing her pain. Will  treat with medical mouthwash and analgesia.    Gilda Crease, MD 04/01/15 [phone]

## 2018-10-19 ENCOUNTER — Other Ambulatory Visit: Payer: Self-pay

## 2018-10-19 ENCOUNTER — Emergency Department (HOSPITAL_COMMUNITY): Payer: Medicare Other

## 2018-10-19 ENCOUNTER — Emergency Department (HOSPITAL_COMMUNITY)
Admission: EM | Admit: 2018-10-19 | Discharge: 2018-10-19 | Disposition: A | Discharge status: Home / Self Care | Payer: Medicare Other | Attending: Emergency Medicine | Admitting: Emergency Medicine

## 2018-10-19 DIAGNOSIS — B349 Viral infection, unspecified: Secondary | ICD-10-CM

## 2018-10-19 DIAGNOSIS — I10 Essential (primary) hypertension: Secondary | ICD-10-CM | POA: Diagnosis not present

## 2018-10-19 DIAGNOSIS — R1084 Generalized abdominal pain: Secondary | ICD-10-CM | POA: Diagnosis not present

## 2018-10-19 DIAGNOSIS — Z79899 Other long term (current) drug therapy: Secondary | ICD-10-CM | POA: Diagnosis not present

## 2018-10-19 DIAGNOSIS — R05 Cough: Secondary | ICD-10-CM | POA: Diagnosis present

## 2018-10-19 LAB — CBC WITH DIFFERENTIAL/PLATELET
Abs Immature Granulocytes: 0 10*3/uL (ref 0.00–0.07)
BASOS ABS: 0 10*3/uL (ref 0.0–0.1)
Basophils Relative: 0 %
EOS ABS: 0 10*3/uL (ref 0.0–0.5)
Eosinophils Relative: 0 %
HEMATOCRIT: 42.5 % (ref 36.0–46.0)
Hemoglobin: 13.4 g/dL (ref 12.0–15.0)
IMMATURE GRANULOCYTES: 0 %
LYMPHS ABS: 1.8 10*3/uL (ref 0.7–4.0)
Lymphocytes Relative: 50 %
MCH: 26.8 pg (ref 26.0–34.0)
MCHC: 31.5 g/dL (ref 30.0–36.0)
MCV: 85 fL (ref 80.0–100.0)
MONOS PCT: 9 %
Monocytes Absolute: 0.3 10*3/uL (ref 0.1–1.0)
NEUTROS PCT: 41 %
Neutro Abs: 1.5 10*3/uL — ABNORMAL LOW (ref 1.7–7.7)
Platelets: 211 10*3/uL (ref 150–400)
RBC: 5 MIL/uL (ref 3.87–5.11)
RDW: 13.2 % (ref 11.5–15.5)
WBC: 3.7 10*3/uL — ABNORMAL LOW (ref 4.0–10.5)
nRBC: 0 % (ref 0.0–0.2)

## 2018-10-19 LAB — COMPREHENSIVE METABOLIC PANEL
ALK PHOS: 69 U/L (ref 38–126)
ALT: 20 U/L (ref 0–44)
ANION GAP: 13 (ref 5–15)
AST: 33 U/L (ref 15–41)
Albumin: 3.9 g/dL (ref 3.5–5.0)
BILIRUBIN TOTAL: 0.4 mg/dL (ref 0.3–1.2)
BUN: 12 mg/dL (ref 8–23)
CALCIUM: 9.1 mg/dL (ref 8.9–10.3)
CO2: 21 mmol/L — AB (ref 22–32)
Chloride: 104 mmol/L (ref 98–111)
Creatinine, Ser: 1.08 mg/dL — ABNORMAL HIGH (ref 0.44–1.00)
GFR calc non Af Amer: 53 mL/min — ABNORMAL LOW (ref >60)
Glucose, Bld: 96 mg/dL (ref 70–99)
Potassium: 3.5 mmol/L (ref 3.5–5.1)
SODIUM: 138 mmol/L (ref 135–145)
Total Protein: 8.3 g/dL — ABNORMAL HIGH (ref 6.5–8.1)

## 2018-10-19 LAB — I-STAT VENOUS BLOOD GAS, ED
Acid-base deficit: 3 mmol/L — ABNORMAL HIGH (ref 0.0–2.0)
Bicarbonate: 18.9 mmol/L — ABNORMAL LOW (ref 20.0–28.0)
O2 SAT: 99 %
PO2 VEN: 115 mmHg — AB (ref 32.0–45.0)
TCO2: 20 mmol/L — ABNORMAL LOW (ref 22–32)
pCO2, Ven: 25.8 mmHg — ABNORMAL LOW (ref 44.0–60.0)
pH, Ven: 7.473 — ABNORMAL HIGH (ref 7.250–7.430)

## 2018-10-19 LAB — URINALYSIS, ROUTINE W REFLEX MICROSCOPIC
Bilirubin Urine: NEGATIVE
GLUCOSE, UA: NEGATIVE mg/dL
Hgb urine dipstick: NEGATIVE
KETONES UR: 5 mg/dL — AB
LEUKOCYTES UA: NEGATIVE
Nitrite: NEGATIVE
PH: 7 (ref 5.0–8.0)
Protein, ur: NEGATIVE mg/dL
SPECIFIC GRAVITY, URINE: 1.006 (ref 1.005–1.030)

## 2018-10-19 LAB — LIPASE, BLOOD: Lipase: 27 U/L (ref 11–51)

## 2018-10-19 MED ORDER — SODIUM CHLORIDE 0.9 % IV BOLUS
1000.0000 mL | Freq: Once | INTRAVENOUS | Status: AC
  Administered 2018-10-19: 1000 mL via INTRAVENOUS

## 2018-10-19 MED ORDER — TRAMADOL HCL 50 MG PO TABS: 50.0000 mg | ORAL_TABLET | Freq: Four times a day (QID) | ORAL | 0 refills | Status: DC | PRN

## 2018-10-19 MED ORDER — TRAMADOL HCL 50 MG PO TABS
50.0000 mg | ORAL_TABLET | Freq: Once | ORAL | Status: AC
  Administered 2018-10-19: 50 mg via ORAL
  Filled 2018-10-19: qty 1

## 2018-10-19 NOTE — Discharge Instructions (Addendum)
Get plenty of rest and drink a lot of fluids.  Use Robitussin-DM for cough.  Try taking Tylenol for pain.  We are prescribing a pain reliever to use as needed for moderate pain.  Follow-up with the doctor of your choice if not better in 3 to 4 days.

## 2018-10-19 NOTE — ED Provider Notes (Signed)
[facility] Provider Note   CSN: [id number] Arrival date & time: 10/19/18  [id number]     History   Chief Complaint Chief Complaint  Patient presents with  . Influenza    HPI Erika Case is a 68 y.o. female.  HPI   Presents for evaluation of general achiness with cough, congestion, abdominal pain, nausea, vomiting, diarrhea, for the last week.  She is worried that she has "the flu."  I have not immunization this year.  She does not take any chronic medication.  She lives with family members.  No known sick contacts.  There are no other known modifying factors.  Past Medical History:  Diagnosis Date  . Hypertension     There are no active problems to display for this patient.   No past surgical history on file.   OB History   No obstetric history on file.      Home Medications    Prior to Admission medications   Medication Sig Start Date End Date Taking? Authorizing Provider  acetaminophen (TYLENOL) 325 MG tablet Take 325 mg by mouth every 6 (six) hours as needed for mild pain or moderate pain.    [provider]  Alum & Mag Hydroxide-Simeth (MAGIC MOUTHWASH W/LIDOCAINE) SOLN Take 5 mLs by mouth 4 (four) times daily as needed for mouth pain. 04/01/15   Gilda CreasePollina, Christopher J, MD  ibuprofen (ADVIL,MOTRIN) 200 MG tablet Take 400 mg by mouth every 6 (six) hours as needed. pain     [provider]  traMADol (ULTRAM) 50 MG tablet Take 1 tablet (50 mg total) by mouth every 6 (six) hours as needed for moderate pain. 10/19/18   Mancel BaleWentz, Zriyah Kopplin, MD    Family History No family history on file.  Social History Social History   Tobacco Use  . Smoking status: Never Smoker  Substance Use Topics  . Alcohol use: No  . Drug use: No     Allergies   Patient has no known allergies.   Review of Systems Review of Systems  All other systems reviewed and are negative.    Physical Exam Updated Vital Signs BP 120/78 (BP  Location: Right Arm)   Pulse 83   Temp 98.4 F (36.9 C) (Oral)   Resp 16   SpO2 100%   Physical Exam Vitals signs and nursing note reviewed.  Constitutional:      General: She is not in acute distress.    Appearance: Normal appearance. She is well-developed. She is not ill-appearing or diaphoretic.  HENT:     Head: Normocephalic and atraumatic.     Right Ear: External ear normal.     Left Ear: External ear normal.     Nose: Nose normal.     Mouth/Throat:     Pharynx: No oropharyngeal exudate or posterior oropharyngeal erythema.  Eyes:     Conjunctiva/sclera: Conjunctivae normal.     Pupils: Pupils are equal, round, and reactive to light.  Neck:     Musculoskeletal: Normal range of motion and neck supple.     Trachea: Phonation normal.  Cardiovascular:     Rate and Rhythm: Normal rate and regular rhythm.     Heart sounds: Normal heart sounds.  Pulmonary:     Effort: Pulmonary effort is normal. No respiratory distress.     Breath sounds: Normal breath sounds. No stridor. No wheezing, rhonchi or rales.  Chest:     Chest wall: No tenderness.  Abdominal:     Palpations:  Abdomen is soft.     Tenderness: There is abdominal tenderness (Epigastric, mild.). There is no guarding or rebound.     Hernia: No hernia is present.  Musculoskeletal: Normal range of motion.  Skin:    General: Skin is warm and dry.  Neurological:     Mental Status: She is alert and oriented to person, place, and time.     Cranial Nerves: No cranial nerve deficit.     Sensory: No sensory deficit.     Motor: No abnormal muscle tone.     Coordination: Coordination normal.  Psychiatric:        Behavior: Behavior normal.        Thought Content: Thought content normal.        Judgment: Judgment normal.      ED Treatments / Results  Labs (all labs ordered are listed, but only abnormal results are displayed) Labs Reviewed  CBC WITH DIFFERENTIAL/PLATELET - Abnormal; Notable for the following components:        Result Value   WBC 3.7 (*)    Neutro Abs 1.5 (*)    All other components within normal limits  URINALYSIS, ROUTINE W REFLEX MICROSCOPIC - Abnormal; Notable for the following components:   Ketones, ur 5 (*)    All other components within normal limits  COMPREHENSIVE METABOLIC PANEL - Abnormal; Notable for the following components:   CO2 21 (*)    Creatinine, Ser 1.08 (*)    Total Protein 8.3 (*)    GFR calc non Af Amer 53 (*)    All other components within normal limits  I-STAT VENOUS BLOOD GAS, ED - Abnormal; Notable for the following components:   pH, Ven 7.473 (*)    pCO2, Ven 25.8 (*)    pO2, Ven 115.0 (*)    Bicarbonate 18.9 (*)    TCO2 20 (*)    Acid-base deficit 3.0 (*)    All other components within normal limits  LIPASE, BLOOD    EKG None  Radiology Dg Chest 2 View  Result Date: 10/19/2018 CLINICAL DATA:  Cough and shortness of breath. EXAM: CHEST - 2 VIEW COMPARISON:  None. FINDINGS: Coarse interstitial lung markings are likely chronic. There is a moderate to large sized hiatal hernia. Heart size is normal. No pleural effusions. Bone structures are unremarkable. Trachea is midline. IMPRESSION: 1. Coarse interstitial lung markings are likely chronic. No acute cardiopulmonary disease. 2. Hiatal hernia. Electronically Signed   By: Richarda OverlieAdam  Henn M.D.   On: 10/19/2018 09:52    Procedures Procedures (including critical care time)  Medications Ordered in ED Medications  sodium chloride 0.9 % bolus 1,000 mL (0 mLs Intravenous Stopped 10/19/18 1104)  traMADol (ULTRAM) tablet 50 mg (50 mg Oral Given 10/19/18 1228)     Initial Impression / Assessment and Plan / ED Course  I have reviewed the triage vital signs and the nursing notes.  Pertinent labs & imaging results that were available during my care of the patient were reviewed by me and considered in my medical decision making (see chart for details).  Clinical Course as of Oct 19 1454  Wed Oct 19, 2018  1158 Normal  except white count low, neutrophils low  CBC with Differential(!) [EW]  1158 Essentially normal venous gas  I-Stat venous blood gas, ED(!) [EW]  1211 She is ambulating easily to the bathroom to get a sample.  At this time she continues to complain of upper abdominal pain, but she is hungry.  She is having  intermittent pain which comes briefly then goes away quickly.  She would like a pain pill for it.   [EW]  1445 Normal except CO2 low, creatinine high, total protein high, GFR low  Comprehensive metabolic panel(!) [EW]  1446 Normal  Urinalysis, Routine w reflex microscopic(!) [EW]  1446 GFR, Est African American: >60 [EW]  1447 No infiltrate or CHF, images reviewed by me  DG Chest 2 View [EW]    Clinical Course User Index [EW] Mancel Bale, MD     Patient Vitals for the past 24 hrs:  BP Temp Temp src Pulse Resp SpO2  10/19/18 1448 120/78 - - 83 16 100 %  10/19/18 1229 110/73 - - (!) 51 16 99 %  10/19/18 1152 110/73 - - 81 - 93 %  10/19/18 1100 (!) 149/95 - - - - -  10/19/18 0901 112/84 98.4 F (36.9 C) Oral (!) 120 20 98 %    2:47 PM Reevaluation with update and discussion. After initial assessment and treatment, an updated evaluation reveals vital signs are reassuring.  The patient appears somewhat nervous, and when asked she said she just needs to get her nerves calm down little bit.  I offered additional assistance and she declined.  She is comfortable going home at this time.  Findings discussed and questions answered. Mancel Bale   Medical Decision Making: Toma Deiters, with nonspecific findings.  Possible URI.  Doubt ACS, PE or pneumonia.  Nonspecific nervousness.  No evident acute unstable psychiatric condition.  CRITICAL CARE-no Performed by: Mancel Bale  Nursing Notes Reviewed/ Care Coordinated Applicable Imaging Reviewed Interpretation of Laboratory Data incorporated into ED treatment  The patient appears reasonably screened and/or stabilized for discharge and I  doubt any other medical condition or other [facility] requiring further screening, evaluation, or treatment in the ED at this time prior to discharge.  Plan: Home Medications-continue usual medications; Home Treatments-she will advance diet and activity; return here if the recommended treatment, does not improve the symptoms; Recommended follow up-PCP checkup 1 week and as needed    Final Clinical Impressions(s) / ED Diagnoses   Final diagnoses:  Viral illness  Generalized abdominal pain    ED Discharge Orders         Ordered    traMADol (ULTRAM) 50 MG tablet  Every 6 hours PRN     10/19/18 1455           Mancel Bale, MD 10/19/18 1456

## 2018-10-19 NOTE — ED Triage Notes (Signed)
Pt here with c/o flu like symptoms , general aches and chills and weakness

## 2021-02-01 ENCOUNTER — Inpatient Hospital Stay (HOSPITAL_COMMUNITY)
Admission: EM | Admit: 2021-02-01 | Discharge: 2021-02-04 | DRG: 689 | Disposition: A | Discharge status: Home Health | Payer: Medicare Other | Attending: Internal Medicine | Admitting: Internal Medicine

## 2021-02-01 ENCOUNTER — Encounter (HOSPITAL_COMMUNITY): Payer: Self-pay | Admitting: Emergency Medicine

## 2021-02-01 ENCOUNTER — Emergency Department (HOSPITAL_COMMUNITY): Payer: Medicare Other

## 2021-02-01 ENCOUNTER — Other Ambulatory Visit: Payer: Self-pay

## 2021-02-01 DIAGNOSIS — N179 Acute kidney failure, unspecified: Secondary | ICD-10-CM | POA: Diagnosis present

## 2021-02-01 DIAGNOSIS — E872 Acidosis, unspecified: Secondary | ICD-10-CM

## 2021-02-01 DIAGNOSIS — N10 Acute tubulo-interstitial nephritis: Secondary | ICD-10-CM | POA: Diagnosis present

## 2021-02-01 DIAGNOSIS — I10 Essential (primary) hypertension: Secondary | ICD-10-CM | POA: Diagnosis present

## 2021-02-01 DIAGNOSIS — N136 Pyonephrosis: Principal | ICD-10-CM | POA: Diagnosis present

## 2021-02-01 DIAGNOSIS — E860 Dehydration: Secondary | ICD-10-CM | POA: Diagnosis present

## 2021-02-01 DIAGNOSIS — Z20822 Contact with and (suspected) exposure to covid-19: Secondary | ICD-10-CM | POA: Diagnosis present

## 2021-02-01 DIAGNOSIS — R112 Nausea with vomiting, unspecified: Secondary | ICD-10-CM

## 2021-02-01 DIAGNOSIS — E876 Hypokalemia: Secondary | ICD-10-CM | POA: Diagnosis present

## 2021-02-01 DIAGNOSIS — B9620 Unspecified Escherichia coli [E. coli] as the cause of diseases classified elsewhere: Secondary | ICD-10-CM | POA: Diagnosis present

## 2021-02-01 DIAGNOSIS — B951 Streptococcus, group B, as the cause of diseases classified elsewhere: Secondary | ICD-10-CM | POA: Diagnosis present

## 2021-02-01 DIAGNOSIS — Z6830 Body mass index (BMI) 30.0-30.9, adult: Secondary | ICD-10-CM

## 2021-02-01 DIAGNOSIS — G9341 Metabolic encephalopathy: Secondary | ICD-10-CM | POA: Diagnosis present

## 2021-02-01 DIAGNOSIS — R42 Dizziness and giddiness: Secondary | ICD-10-CM | POA: Diagnosis present

## 2021-02-01 DIAGNOSIS — R109 Unspecified abdominal pain: Secondary | ICD-10-CM

## 2021-02-01 DIAGNOSIS — Z8249 Family history of ischemic heart disease and other diseases of the circulatory system: Secondary | ICD-10-CM

## 2021-02-01 DIAGNOSIS — N12 Tubulo-interstitial nephritis, not specified as acute or chronic: Secondary | ICD-10-CM

## 2021-02-01 DIAGNOSIS — E669 Obesity, unspecified: Secondary | ICD-10-CM | POA: Diagnosis present

## 2021-02-01 LAB — SARS CORONAVIRUS 2 (TAT 6-24 HRS): SARS Coronavirus 2: NEGATIVE

## 2021-02-01 LAB — URINALYSIS, ROUTINE W REFLEX MICROSCOPIC
Bilirubin Urine: NEGATIVE
Glucose, UA: NEGATIVE mg/dL
Ketones, ur: NEGATIVE mg/dL
Nitrite: NEGATIVE
Protein, ur: NEGATIVE mg/dL
Specific Gravity, Urine: 1.005 (ref 1.005–1.030)
pH: 7 (ref 5.0–8.0)

## 2021-02-01 LAB — CBC
HCT: 38.7 % (ref 36.0–46.0)
Hemoglobin: 12.4 g/dL (ref 12.0–15.0)
MCH: 28.2 pg (ref 26.0–34.0)
MCHC: 32 g/dL (ref 30.0–36.0)
MCV: 88.2 fL (ref 80.0–100.0)
Platelets: 211 10*3/uL (ref 150–400)
RBC: 4.39 MIL/uL (ref 3.87–5.11)
RDW: 12.8 % (ref 11.5–15.5)
WBC: 9.6 10*3/uL (ref 4.0–10.5)
nRBC: 0 % (ref 0.0–0.2)

## 2021-02-01 LAB — COMPREHENSIVE METABOLIC PANEL
ALT: 17 U/L (ref 0–44)
AST: 24 U/L (ref 15–41)
Albumin: 3.7 g/dL (ref 3.5–5.0)
Alkaline Phosphatase: 81 U/L (ref 38–126)
Anion gap: 12 (ref 5–15)
BUN: 12 mg/dL (ref 8–23)
CO2: 23 mmol/L (ref 22–32)
Calcium: 9.3 mg/dL (ref 8.9–10.3)
Chloride: 105 mmol/L (ref 98–111)
Creatinine, Ser: 1.33 mg/dL — ABNORMAL HIGH (ref 0.44–1.00)
GFR, Estimated: 43 mL/min — ABNORMAL LOW (ref >60)
Glucose, Bld: 120 mg/dL — ABNORMAL HIGH (ref 70–99)
Potassium: 3.2 mmol/L — ABNORMAL LOW (ref 3.5–5.1)
Sodium: 140 mmol/L (ref 135–145)
Total Bilirubin: 0.6 mg/dL (ref 0.3–1.2)
Total Protein: 7.2 g/dL (ref 6.5–8.1)

## 2021-02-01 LAB — LIPASE, BLOOD: Lipase: 30 U/L (ref 11–51)

## 2021-02-01 LAB — LACTIC ACID, PLASMA
Lactic Acid, Venous: 3.6 mmol/L (ref 0.5–1.9)
Lactic Acid, Venous: 2.1 mmol/L (ref 0.5–1.9)

## 2021-02-01 LAB — HIV ANTIBODY (ROUTINE TESTING W REFLEX): HIV Screen 4th Generation wRfx: NONREACTIVE

## 2021-02-01 MED ORDER — POTASSIUM CHLORIDE CRYS ER 20 MEQ PO TBCR
40.0000 meq | EXTENDED_RELEASE_TABLET | Freq: Once | ORAL | Status: AC
  Administered 2021-02-01: 40 meq via ORAL
  Filled 2021-02-01: qty 2

## 2021-02-01 MED ORDER — SODIUM CHLORIDE 0.9 % IV SOLN
1.0000 g | Freq: Once | INTRAVENOUS | Status: AC
  Administered 2021-02-01: 1 g via INTRAVENOUS
  Filled 2021-02-01: qty 10

## 2021-02-01 MED ORDER — ACETAMINOPHEN 650 MG RE SUPP: 650.0000 mg | Freq: Four times a day (QID) | RECTAL | Status: DC | PRN

## 2021-02-01 MED ORDER — ACETAMINOPHEN 325 MG PO TABS
650.0000 mg | ORAL_TABLET | Freq: Four times a day (QID) | ORAL | Status: DC | PRN
  Administered 2021-02-01 – 2021-02-03 (×2): 650 mg via ORAL
  Filled 2021-02-01 (×2): qty 2

## 2021-02-01 MED ORDER — MORPHINE SULFATE (PF) 4 MG/ML IV SOLN
4.0000 mg | Freq: Once | INTRAVENOUS | Status: AC
  Administered 2021-02-01: 4 mg via INTRAVENOUS
  Filled 2021-02-01: qty 1

## 2021-02-01 MED ORDER — POLYETHYLENE GLYCOL 3350 17 G PO PACK
17.0000 g | PACK | Freq: Every day | ORAL | Status: DC | PRN
  Administered 2021-02-02: 17 g via ORAL
  Filled 2021-02-01: qty 1

## 2021-02-01 MED ORDER — ONDANSETRON HCL 4 MG/2ML IJ SOLN
4.0000 mg | Freq: Once | INTRAMUSCULAR | Status: AC
  Administered 2021-02-01: 4 mg via INTRAVENOUS
  Filled 2021-02-01: qty 2

## 2021-02-01 MED ORDER — ENOXAPARIN SODIUM 40 MG/0.4ML ~~LOC~~ SOLN
40.0000 mg | SUBCUTANEOUS | Status: DC
  Administered 2021-02-01: 40 mg via SUBCUTANEOUS
  Filled 2021-02-01: qty 0.4

## 2021-02-01 MED ORDER — SODIUM CHLORIDE 0.9 % IV BOLUS
1000.0000 mL | Freq: Once | INTRAVENOUS | Status: AC
  Administered 2021-02-01: 1000 mL via INTRAVENOUS

## 2021-02-01 MED ORDER — IOHEXOL 300 MG/ML  SOLN
80.0000 mL | Freq: Once | INTRAMUSCULAR | Status: AC | PRN
  Administered 2021-02-01: 80 mL via INTRAVENOUS

## 2021-02-01 MED ORDER — HYDROCODONE-ACETAMINOPHEN 5-325 MG PO TABS
1.0000 | ORAL_TABLET | Freq: Four times a day (QID) | ORAL | Status: DC | PRN
  Administered 2021-02-01 – 2021-02-02 (×2): 1 via ORAL
  Administered 2021-02-03 (×2): 2 via ORAL
  Filled 2021-02-01: qty 2
  Filled 2021-02-01: qty 1
  Filled 2021-02-01: qty 2
  Filled 2021-02-01: qty 1

## 2021-02-01 MED ORDER — ADULT MULTIVITAMIN W/MINERALS CH
1.0000 | ORAL_TABLET | Freq: Every day | ORAL | Status: DC
  Administered 2021-02-01 – 2021-02-03 (×3): 1 via ORAL
  Filled 2021-02-01 (×3): qty 1

## 2021-02-01 MED ORDER — SODIUM CHLORIDE 0.9% FLUSH
3.0000 mL | Freq: Two times a day (BID) | INTRAVENOUS | Status: DC
  Administered 2021-02-01 – 2021-02-04 (×6): 3 mL via INTRAVENOUS

## 2021-02-01 MED ORDER — ONDANSETRON HCL 4 MG/2ML IJ SOLN
4.0000 mg | Freq: Four times a day (QID) | INTRAMUSCULAR | Status: DC | PRN
  Administered 2021-02-01 – 2021-02-02 (×2): 4 mg via INTRAVENOUS
  Filled 2021-02-01 (×2): qty 2

## 2021-02-01 NOTE — ED Provider Notes (Signed)
[facility] EMERGENCY DEPARTMENT Provider Note   CSN: [id number] Arrival date & time: 02/01/21  2694     History Chief Complaint  Patient presents with  . Abdominal Pain    Erika Case is a 70 y.o. female.  The history is provided by the patient, medical records and a relative.  Abdominal Pain Pain location:  LLQ and L flank Pain quality: aching and sharp   Pain radiates to:  L flank Pain severity:  Severe Onset quality:  Sudden Timing:  Constant Progression:  Unchanged Chronicity:  New Relieved by:  Nothing Worsened by:  Nothing Associated symptoms: constipation, fatigue, nausea and vomiting   Associated symptoms: no chest pain, no chills, no cough, no diarrhea, no dysuria, no fever, no flatus, no shortness of breath, no vaginal bleeding and no vaginal discharge        Past Medical History:  Diagnosis Date  . Hypertension     There are no problems to display for this patient.   History reviewed. No pertinent surgical history.   OB History   No obstetric history on file.     No family history on file.  Social History   Tobacco Use  . Smoking status: Never Smoker  Substance Use Topics  . Alcohol use: No  . Drug use: No    Home Medications Prior to Admission medications   Medication Sig Start Date End Date Taking? Authorizing Provider  acetaminophen (TYLENOL) 325 MG tablet Take 325 mg by mouth every 6 (six) hours as needed for mild pain or moderate pain.    [provider]  Alum & Mag Hydroxide-Simeth (MAGIC MOUTHWASH W/LIDOCAINE) SOLN Take 5 mLs by mouth 4 (four) times daily as needed for mouth pain. 04/01/15   Gilda Crease, MD  ibuprofen (ADVIL,MOTRIN) 200 MG tablet Take 400 mg by mouth every 6 (six) hours as needed. pain     [provider]  traMADol (ULTRAM) 50 MG tablet Take 1 tablet (50 mg total) by mouth every 6 (six) hours as needed for moderate pain. 10/19/18   Mancel Bale, MD    Allergies     Patient has no known allergies.  Review of Systems   Review of Systems  Constitutional: Positive for fatigue. Negative for chills, diaphoresis and fever.  HENT: Negative for congestion.   Eyes: Negative for visual disturbance.  Respiratory: Negative for cough, chest tightness and shortness of breath.   Cardiovascular: Negative for chest pain and palpitations.  Gastrointestinal: Positive for abdominal pain, constipation, nausea and vomiting. Negative for blood in stool, diarrhea and flatus.  Genitourinary: Negative for dysuria, flank pain, vaginal bleeding and vaginal discharge.  Musculoskeletal: Negative for back pain, neck pain and neck stiffness.  Skin: Negative for rash and wound.  Neurological: Negative for light-headedness and headaches.  Psychiatric/Behavioral: Negative for agitation and confusion.  All other systems reviewed and are negative.   Physical Exam Updated Vital Signs BP (!) 149/87 (BP Location: Left Arm)   Pulse 91   Temp 98.1 F (36.7 C) (Oral)   Resp (!) 22   SpO2 100%   Physical Exam Vitals and nursing note reviewed.  Constitutional:      General: She is not in acute distress.    Appearance: She is well-developed. She is not ill-appearing, toxic-appearing or diaphoretic.  HENT:     Head: Normocephalic and atraumatic.  Eyes:     Conjunctiva/sclera: Conjunctivae normal.  Cardiovascular:     Rate and Rhythm: Normal rate and regular rhythm.  Heart sounds: No murmur heard.   Pulmonary:     Effort: Pulmonary effort is normal. No respiratory distress.     Breath sounds: Normal breath sounds. No wheezing, rhonchi or rales.  Chest:     Chest wall: No tenderness.  Abdominal:     General: Abdomen is flat. Bowel sounds are normal. There is no distension.     Palpations: Abdomen is soft.     Tenderness: There is abdominal tenderness in the left upper quadrant and left lower quadrant. There is left CVA tenderness. There is no right CVA tenderness, guarding  or rebound.  Musculoskeletal:     Cervical back: Neck supple.  Skin:    General: Skin is warm and dry.  Neurological:     General: No focal deficit present.     Mental Status: She is alert.  Psychiatric:        Mood and Affect: Mood is anxious.     ED Results / Procedures / Treatments   Labs (all labs ordered are listed, but only abnormal results are displayed) Labs Reviewed  COMPREHENSIVE METABOLIC PANEL - Abnormal; Notable for the following components:      Result Value   Potassium 3.2 (*)    Glucose, Bld 120 (*)    Creatinine, Ser 1.33 (*)    GFR, Estimated 43 (*)    All other components within normal limits  URINALYSIS, ROUTINE W REFLEX MICROSCOPIC - Abnormal; Notable for the following components:   APPearance HAZY (*)    Hgb urine dipstick MODERATE (*)    Leukocytes,Ua MODERATE (*)    Bacteria, UA MANY (*)    All other components within normal limits  LACTIC ACID, PLASMA - Abnormal; Notable for the following components:   Lactic Acid, Venous 3.6 (*)    All other components within normal limits  URINE CULTURE  LIPASE, BLOOD  CBC  LACTIC ACID, PLASMA  HIV ANTIBODY (ROUTINE TESTING W REFLEX)    EKG None  Radiology CT ABDOMEN PELVIS W CONTRAST  Result Date: 02/01/2021 CLINICAL DATA:  Nausea, vomiting and left lower quadrant abdomen pain. Assess for diverticulitis. EXAM: CT ABDOMEN AND PELVIS WITH CONTRAST TECHNIQUE: Multidetector CT imaging of the abdomen and pelvis was performed using the standard protocol following bolus administration of intravenous contrast. CONTRAST:  67mL OMNIPAQUE IOHEXOL 300 MG/ML  SOLN COMPARISON:  None. FINDINGS: Lower chest: Mild atelectasis is identified in bilateral lung bases. The heart size is normal. Hepatobiliary: No focal liver abnormality is seen. No gallstones, gallbladder wall thickening, or biliary dilatation. Pancreas: Unremarkable. No pancreatic ductal dilatation or surrounding inflammatory changes. Spleen: Normal in size without  focal abnormality. Adrenals/Urinary Tract: There is left perinephric stranding and mild left hydronephrosis without focal discrete obstructing stone identified in the left collecting system. The right kidney is normal. The bilateral adrenal glands are normal. The bladder is normal. Stomach/Bowel: Stomach is within normal limits. Appendix appears normal. No evidence of bowel wall thickening, distention, or inflammatory changes. There is no diverticulitis. Vascular/Lymphatic: Aortic atherosclerosis. No enlarged abdominal or pelvic lymph nodes. Reproductive: Uterus and bilateral adnexa are unremarkable. Other: None. Musculoskeletal: Degenerative joint changes of the thoracic and lower lumbar spine identified. IMPRESSION: 1. Left perinephric stranding and mild left hydronephrosis without focal discrete obstructing stone identified in the left collecting system. The findings are nonspecific but can be seen in pyelonephritis. 2. No evidence of diverticulitis. 3. Aortic atherosclerosis. Aortic Atherosclerosis (ICD10-I70.0). Electronically Signed   By: Sherian Rein M.D.   On: 02/01/2021 11:50  Procedures Procedures   CRITICAL CARE Performed by: Canary Brim Hudsyn Champine Total critical care time: 35 minutes Critical care time was exclusive of separately billable procedures and treating other patients. Critical care was necessary to treat or prevent imminent or life-threatening deterioration. Critical care was time spent personally by me on the following activities: development of treatment plan with patient and/or surrogate as well as nursing, discussions with consultants, evaluation of patient's response to treatment, examination of patient, obtaining history from patient or surrogate, ordering and performing treatments and interventions, ordering and review of laboratory studies, ordering and review of radiographic studies, pulse oximetry and re-evaluation of patient's condition.   Medications Ordered in  ED Medications  enoxaparin (LOVENOX) injection 40 mg (has no administration in time range)  sodium chloride flush (NS) 0.9 % injection 3 mL (has no administration in time range)  acetaminophen (TYLENOL) tablet 650 mg (has no administration in time range)    Or  acetaminophen (TYLENOL) suppository 650 mg (has no administration in time range)  polyethylene glycol (MIRALAX / GLYCOLAX) packet 17 g (has no administration in time range)  HYDROcodone-acetaminophen (NORCO/VICODIN) 5-325 MG per tablet 1-2 tablet (has no administration in time range)  multivitamin with minerals tablet 1 tablet (has no administration in time range)  potassium chloride SA (KLOR-CON) CR tablet 40 mEq (has no administration in time range)  morphine 4 MG/ML injection 4 mg (4 mg Intravenous Given 02/01/21 0840)  ondansetron (ZOFRAN) injection 4 mg (4 mg Intravenous Given 02/01/21 0840)  sodium chloride 0.9 % bolus 1,000 mL (0 mLs Intravenous Stopped 02/01/21 1303)  iohexol (OMNIPAQUE) 300 MG/ML solution 80 mL (80 mLs Intravenous Contrast Given 02/01/21 1141)  cefTRIAXone (ROCEPHIN) 1 g in sodium chloride 0.9 % 100 mL IVPB (0 g Intravenous Stopped 02/01/21 1409)  sodium chloride 0.9 % bolus 1,000 mL (1,000 mLs Intravenous Bolus 02/01/21 1411)    ED Course  I have reviewed the triage vital signs and the nursing notes.  Pertinent labs & imaging results that were available during my care of the patient were reviewed by me and considered in my medical decision making (see chart for details).    MDM Rules/Calculators/A&P                          Erie Spofford is a 70 y.o. female with a past medical history significant for hypertension and family report of chronic constipation who presents with severe left lower quadrant abdominal pain, nausea, and vomiting.  According to patient, she was feeling fatigued last night going to bed but then this morning woke up with severe abdominal pain.  She reports numerous episodes of nausea and  vomiting and pain in her left lower quadrant and left abdomen.  She has not had this pain before.  She reports she has not having any bowel movements or passing gas.  She denies any diarrhea to me.  She reports no recent medication changes.  She denies any trauma.  She reports the pain is severe and she is rolling over the bed and pain.  She reports no chest pain, palpitations, shortness of breath.  She denies fevers or chills.  She denies any urinary changes.  She denies any blood in her emesis or her urine that she can see.  She denies other complaints on arrival.  On exam, lungs are clear and chest is nontender.  Abdomen is exquisitely tender in the left abdomen and left lower quadrant.  She has tenderness in her  left flank and left CVA area but no midline back tenderness.  Bowel sounds were difficult to auscultate due to patient rolling around on the bed in pain.  Clinically I am concerned about multiple etiologies of the left lower quadrant abdominal pain, nausea, vomiting, and decreased flatus and absent bowel movement.  We will get CT imaging to look for bowel obstruction, diverticulitis, and even look for kidney stone given the location of discomfort.  We will get urinalysis and labs.  We will give her pain medicine, nausea, and fluids and she will remain n.p.o.  Anticipate reassessment for work-up to determine disposition.  12:55 PM Work-up has begun to return.  Patient's lactic acid is elevated at 3.6.  We will start some more fluids.  Urinalysis shows leukocytes and bacteria, and a CT scan shows evidence of inflamed left kidney consistent with pyelonephritis.  With the left abdominal flank pain and CVA pain, I am concerned about pyelonephritis.  The son is expressing concern that he thinks his mother is confused and nursing is also reporting that she is not remembering some of the conversations we have had.  I am concerned that she is altered in the setting of pyelonephritis.  We will give  Rocephin and due to the lactic acidosis, the continued pain and nausea requiring medication administration here, will call for admission for IV antibiotics.  We will get a preadmission COVID swab and she will be admitted for IV antibiotics for pyelonephritis.    Final Clinical Impression(s) / ED Diagnoses Final diagnoses:  Left lateral abdominal pain  Pyelonephritis  Lactic acidosis  Nausea and vomiting, intractability of vomiting not specified, unspecified vomiting type    Clinical Impression: 1. Left lateral abdominal pain   2. Pyelonephritis   3. Lactic acidosis   4. Nausea and vomiting, intractability of vomiting not specified, unspecified vomiting type     Disposition: Admit  This note was prepared with assistance of Dragon voice recognition software. Occasional wrong-word or sound-a-like substitutions may have occurred due to the inherent limitations of voice recognition software.     Eleasha Cataldo, Canary Brim, MD 02/01/21 1524

## 2021-02-01 NOTE — ED Notes (Signed)
Pt is ready to go upstairs. Report given and pt is going to be transferred to [facility]. Belongings packed. Pt is currently looking for her necklace that she states she had when she first arrived in ED. Pt was previously in [facility] room with Crystal,RN as her nurse. Crystal called. No necklace or belongings were taken away from patient as pt did not have any radiology imaging except for CT abdomen/pelvis. Pt was notified. Son at bedside.

## 2021-02-01 NOTE — H&P (Signed)
Date: 02/01/2021               Patient Name:  Erika Case MRN: [id number]  DOB: November 08, 1950 Age / Sex: 70 y.o., female   PCP: Patient, No Pcp Per (Inactive)         Medical Service: Internal Medicine Teaching Service         Attending Physician: Dr. Mayford Knife, Dorene Ar, MD    First Contact: Dr. Austin Miles Pager: [phone]  Second Contact: Dr. Huel Cote Pager: [phone]       After Hours (After 5p/  First Contact Pager: [phone]  weekends / holidays): Second Contact Pager: [phone]   Chief Complaint: LLQ pain, nausea, vomiting  History of Present Illness:   Erika Case is a 70 year old woman with history of hypertension presenting with left lower quadrant abdominal pain with associated nausea and vomiting.   Ms. Sager initially states that her left-sided abdominal pain started suddenly yesterday and became a lot worse today. However, she then states she never had pain but developed generalized weakness and "just didn't feel the same." She endorses feeling disoriented and anxious. She endorses mild nausea, but denies vomiting. She does report burning with urination and increased urinary frequency that began a couple of days ago. She denies fever, chills, dysuria, urinary urgency or hematuria. She denies SOB, chest pain.   She does not take any daily medication.    ED course: CBC unremarkable. CMP showing K 3.2, Cr 1.33, BUN 12. Lactate 3.6. UA showing moderate leukocytes, 6-10 WBCs, many bacteria, and hematuria (6-10 RBCs/hpf). Lipase wnl. CT abd/pel w contrast showing left perinephric stranding and mild left hydronephrosis (no stone seen) concerning for pyelonephritis. IMTS paged for admission for pyelonephritis and altered mental status.    Meds:  Current Meds  Medication Sig  . acetaminophen (TYLENOL) 325 MG tablet Take 325 mg by mouth at bedtime as needed (Sleep). Tylenol PM.  . docusate sodium (COLACE) 100 MG capsule Take 100 mg by mouth daily as needed for mild constipation.    Allergies: Allergies as of 02/01/2021  . (No Known Allergies)   Past Medical History:  Diagnosis Date  . Hypertension     Family History: High blood pressure "runs in the family", but no other pertinent family history.  Social History:  Erika Case lives in a home by herself and is Gladish to perform all of her ADLs. States that her children come to visit her daily.   Review of Systems: A complete ROS was negative except as per HPI.   Physical Exam: Blood pressure (!) 149/85, pulse 94, temperature 98 F (36.7 C), temperature source Oral, resp. rate 15, SpO2 100 %. Physical Exam Constitutional:      Appearance: She is obese. She is not ill-appearing.  HENT:     Head: Normocephalic and atraumatic.  Eyes:     Extraocular Movements: Extraocular movements intact.     Pupils: Pupils are equal, round, and reactive to light.  Cardiovascular:     Rate and Rhythm: Normal rate and regular rhythm.     Heart sounds: Normal heart sounds. No murmur heard. No friction rub. No gallop.   Pulmonary:     Effort: Pulmonary effort is normal.     Breath sounds: Normal breath sounds. No wheezing, rhonchi or rales.  Abdominal:     General: Bowel sounds are normal. There is no distension.     Palpations: Abdomen is soft. There is no hepatomegaly.     Tenderness: There is abdominal tenderness  in the left lower quadrant. There is no right CVA tenderness, left CVA tenderness, guarding or rebound.     Hernia: No hernia is present.  Skin:    General: Skin is warm and dry.  Neurological:     Mental Status: She is alert.     Motor: Weakness present.     Comments: AAOx3, but very jittery and changes answers frequently.   Psychiatric:        Mood and Affect: Mood is anxious.      EKG: None  CT ABDOMEN PELVIS W CONTRAST  Result Date: 02/01/2021 CLINICAL DATA:  Nausea, vomiting and left lower quadrant abdomen pain. Assess for diverticulitis. EXAM: CT ABDOMEN AND PELVIS WITH CONTRAST TECHNIQUE:  Multidetector CT imaging of the abdomen and pelvis was performed using the standard protocol following bolus administration of intravenous contrast. CONTRAST:  39mL OMNIPAQUE IOHEXOL 300 MG/ML  SOLN COMPARISON:  None. FINDINGS: Lower chest: Mild atelectasis is identified in bilateral lung bases. The heart size is normal. Hepatobiliary: No focal liver abnormality is seen. No gallstones, gallbladder wall thickening, or biliary dilatation. Pancreas: Unremarkable. No pancreatic ductal dilatation or surrounding inflammatory changes. Spleen: Normal in size without focal abnormality. Adrenals/Urinary Tract: There is left perinephric stranding and mild left hydronephrosis without focal discrete obstructing stone identified in the left collecting system. The right kidney is normal. The bilateral adrenal glands are normal. The bladder is normal. Stomach/Bowel: Stomach is within normal limits. Appendix appears normal. No evidence of bowel wall thickening, distention, or inflammatory changes. There is no diverticulitis. Vascular/Lymphatic: Aortic atherosclerosis. No enlarged abdominal or pelvic lymph nodes. Reproductive: Uterus and bilateral adnexa are unremarkable. Other: None. Musculoskeletal: Degenerative joint changes of the thoracic and lower lumbar spine identified. IMPRESSION: 1. Left perinephric stranding and mild left hydronephrosis without focal discrete obstructing stone identified in the left collecting system. The findings are nonspecific but can be seen in pyelonephritis. 2. No evidence of diverticulitis. 3. Aortic atherosclerosis. Aortic Atherosclerosis (ICD10-I70.0). Electronically Signed   By: Sherian Rein M.D.   On: 02/01/2021 11:50    Assessment & Plan by Problem: Active Problems:   Acute pyelonephritis  AKI 2/2 L sided hydronephrosis Acute L sided pyelonephritis Baseline Cr ~1, on admission Cr 1.33. Patient with LLQ pain, burning with urination, and urinary frequency along with urinalysis and CT  abd/pel collectively suggestive of acute left sided pyelonephritis. No leukocytosis or fever noted, but does have lactic acidosis likely stemming from acute infection. CT abd/pel showing mild L sided hydronephrosis from kidney inflammation 2/2 acute pyelonephritis, which is most likely causing patient's current AKI. Given one dose of IV rocephin and IV morphine 4mg  for pain in the ED. Also received 2L NS bolus for fluid rehydration. Will likely need to continue broad spectrum antibiotics until urine cultures result.  - Pain control with tylenol q6h prn for mild pain/fever, norco 5-325mg  1-2 tablets q6h prn for moderate/severe pain - IV zofran 4mg  q6h prn for nausea and vomiting - f/u urine cultures - f/u repeat lactate - broad spectrum antibiotics until urine cultures result - daily CBC, BMP - avoid nephrotoxic medications - strict I/Os  Hypokalemia K 3.2 likely 2/2 vomiting from above. Repleting with PO KCl once.  - Trend daily BMP  HTN, chronic and stable Not on any home medications for this. Well controlled since admission, so will not start any antihypertensives at this time.   Dispo: Admit patient to Observation with expected length of Case less than 2 midnights.  Signed: , MD 02/01/2021,  4:33 PM  Pager: [phone] After 5pm on weekdays and 1pm on weekends: On Call pager: [phone]

## 2021-02-01 NOTE — ED Triage Notes (Signed)
C/o lower abd cramping, nausea, vomiting, and diarrhea since last night.

## 2021-02-01 NOTE — Progress Notes (Deleted)
Date: 02/01/2021               Patient Name:  Erika Case MRN: [id number]  DOB: 1951/05/26 Age / Sex: 70 y.o., female   PCP: Patient, No Pcp Per (Inactive)         Medical Service: Internal Medicine Teaching Service         Attending Physician: Dr. Mayford Knife, Dorene Ar, MD    First Contact: Dr. Austin Miles Pager: [phone]  Second Contact: Dr. Huel Cote Pager: [phone]       After Hours (After 5p/  First Contact Pager: [phone]  weekends / holidays): Second Contact Pager: [phone]   Chief Complaint: LLQ pain, nausea, vomiting  History of Present Illness:   Erika Case is a 70 year old woman with history of hypertension presenting with left lower quadrant abdominal pain with associated nausea and vomiting.   Erika Case initially states that her left-sided abdominal pain started suddenly yesterday and became a lot worse today. However, she then states she never had pain but developed generalized weakness and "just didn't feel the same." She endorses feeling disoriented and anxious. She endorses mild nausea, but denies vomiting. She does report burning with urination and increased urinary frequency that began a couple of days ago. She denies fever, chills, dysuria, urinary urgency or hematuria. She denies SOB, chest pain.   She does not take any daily medication.    ED course: CBC unremarkable. CMP showing K 3.2, Cr 1.33, BUN 12. Lactate 3.6. UA showing moderate leukocytes, 6-10 WBCs, many bacteria, and hematuria (6-10 RBCs/hpf). Lipase wnl. CT abd/pel w contrast showing left perinephric stranding and mild left hydronephrosis (no stone seen) concerning for pyelonephritis. IMTS paged for admission for pyelonephritis and altered mental status.    Meds:  Current Meds  Medication Sig  . acetaminophen (TYLENOL) 325 MG tablet Take 325 mg by mouth at bedtime as needed (Sleep). Tylenol PM.  . docusate sodium (COLACE) 100 MG capsule Take 100 mg by mouth daily as needed for mild constipation.    Allergies: Allergies as of 02/01/2021  . (No Known Allergies)   Past Medical History:  Diagnosis Date  . Hypertension     Family History: High blood pressure "runs in the family", but no other pertinent family history.  Social History:  Erika Case lives in a home by herself and is Erika Case to perform all of her ADLs. States that her children come to visit her daily.   Review of Systems: A complete ROS was negative except as per HPI.   Physical Exam: Blood pressure 125/78, pulse 99, temperature 98.1 F (36.7 C), temperature source Oral, resp. rate 18, SpO2 98 %. Physical Exam Constitutional:      Appearance: She is obese. She is not ill-appearing.  HENT:     Head: Normocephalic and atraumatic.  Eyes:     Extraocular Movements: Extraocular movements intact.     Pupils: Pupils are equal, round, and reactive to light.  Cardiovascular:     Rate and Rhythm: Normal rate and regular rhythm.     Heart sounds: Normal heart sounds. No murmur heard. No friction rub. No gallop.   Pulmonary:     Effort: Pulmonary effort is normal.     Breath sounds: Normal breath sounds. No wheezing, rhonchi or rales.  Abdominal:     General: Bowel sounds are normal. There is no distension.     Palpations: Abdomen is soft. There is no hepatomegaly.     Tenderness: There is abdominal tenderness in  the left lower quadrant. There is no right CVA tenderness, left CVA tenderness, guarding or rebound.     Hernia: No hernia is present.  Skin:    General: Skin is warm and dry.  Neurological:     Mental Status: She is alert.     Motor: Weakness present.     Comments: AAOx3, but very jittery and changes answers frequently.   Psychiatric:        Mood and Affect: Mood is anxious.      EKG: None  CT ABDOMEN PELVIS W CONTRAST  Result Date: 02/01/2021 CLINICAL DATA:  Nausea, vomiting and left lower quadrant abdomen pain. Assess for diverticulitis. EXAM: CT ABDOMEN AND PELVIS WITH CONTRAST TECHNIQUE:  Multidetector CT imaging of the abdomen and pelvis was performed using the standard protocol following bolus administration of intravenous contrast. CONTRAST:  80mL OMNIPAQUE IOHEXOL 300 MG/ML  SOLN COMPARISON:  None. FINDINGS: Lower chest: Mild atelectasis is identified in bilateral lung bases. The heart size is normal. Hepatobiliary: No focal liver abnormality is seen. No gallstones, gallbladder wall thickening, or biliary dilatation. Pancreas: Unremarkable. No pancreatic ductal dilatation or surrounding inflammatory changes. Spleen: Normal in size without focal abnormality. Adrenals/Urinary Tract: There is left perinephric stranding and mild left hydronephrosis without focal discrete obstructing stone identified in the left collecting system. The right kidney is normal. The bilateral adrenal glands are normal. The bladder is normal. Stomach/Bowel: Stomach is within normal limits. Appendix appears normal. No evidence of bowel wall thickening, distention, or inflammatory changes. There is no diverticulitis. Vascular/Lymphatic: Aortic atherosclerosis. No enlarged abdominal or pelvic lymph nodes. Reproductive: Uterus and bilateral adnexa are unremarkable. Other: None. Musculoskeletal: Degenerative joint changes of the thoracic and lower lumbar spine identified. IMPRESSION: 1. Left perinephric stranding and mild left hydronephrosis without focal discrete obstructing stone identified in the left collecting system. The findings are nonspecific but can be seen in pyelonephritis. 2. No evidence of diverticulitis. 3. Aortic atherosclerosis. Aortic Atherosclerosis (ICD10-I70.0). Electronically Signed   By: Sherian Rein M.D.   On: 02/01/2021 11:50    Assessment & Plan by Problem: Active Problems:   Acute pyelonephritis  AKI 2/2 L sided hydronephrosis Acute L sided pyelonephritis Baseline Cr ~1, on admission Cr 1.33. Patient with LLQ pain, burning with urination, and urinary frequency along with urinalysis and CT  abd/pel collectively suggestive of acute left sided pyelonephritis. No leukocytosis or fever noted, but does have lactic acidosis likely stemming from acute infection. CT abd/pel showing mild L sided hydronephrosis from kidney inflammation 2/2 acute pyelonephritis, which is most likely causing patient's current AKI. Given one dose of IV rocephin and IV morphine  for pain in the ED. Also received 2L NS bolus for fluid rehydration. Will likely need to continue broad spectrum antibiotics until urine cultures result.  - Pain control with tylenol q6h prn for mild pain/fever, norco 5-325mg  1-2 tablets q6h prn for moderate/severe pain - IV zofran  q6h prn for nausea and vomiting - f/u urine cultures - f/u repeat lactate - broad spectrum antibiotics until urine cultures result - daily CBC, BMP - avoid nephrotoxic medications - strict I/Os  Hypokalemia K 3.2 likely 2/2 vomiting from above. Repleting with PO KCl once.  - Trend daily BMP  HTN, chronic and stable Not on any home medications for this. Well controlled since admission, so will not start any antihypertensives at this time.   Dispo: Admit patient to Observation with expected length of stay less than 2 midnights.  Signed: Merrilyn Puma, MD 02/01/2021, 3:28  PM  Pager: [phone] After 5pm on weekdays and 1pm on weekends: On Call pager: [phone]

## 2021-02-01 NOTE — ED Notes (Signed)
Pt placed on bedpan

## 2021-02-01 NOTE — ED Notes (Signed)
Pt was roomed temporarily to [facility].  Prior to moving pt upstairs to inpatient bed she stated that she couldn't find a necklace.  Son stated that he was unaware if pt had jewelery on prior to loading onto the ambulance.  Pt responded that she didn't ride the ambulance.  Son reminded her that she had.  Pt has forgotten moments from today's events. Upon arrival to [facility]09 pts forgetfullness was explained to NA and RN.

## 2021-02-01 NOTE — ED Notes (Addendum)
Unable to get labs due to hard stick. Attempted x 1 with no success. Unable to draw repeat lactic acid. Admit team aware.

## 2021-02-01 NOTE — Hospital Course (Addendum)
70 year old female  Hx of HTN   Left flank / left back pain, N/V, confusion (per son) likely 2/2 pyelonephritis  Lactate 3.6 UA showing leukocytes and bacteria CT showing inflamed L kidney    Erika Case initially states that her left-sided abdominal pain started suddenly yesterday and persisted through today. However, she then states she never had pain but developed generalized weakness and "just didn't feel the same." She endorses feeling disoriented and anxious. She endorses mild nausea. She denies fever, chills, dysuria, urinary frequency, urgency or hematuria.   She denies SOB, chest pain.   She notes that her only pmhx is HTN. No daily medication.   Erika Case lives in a home with her children.

## 2021-02-02 DIAGNOSIS — R42 Dizziness and giddiness: Secondary | ICD-10-CM

## 2021-02-02 DIAGNOSIS — N10 Acute tubulo-interstitial nephritis: Secondary | ICD-10-CM | POA: Diagnosis not present

## 2021-02-02 LAB — HEPATIC FUNCTION PANEL
ALT: 17 U/L (ref 0–44)
AST: 26 U/L (ref 15–41)
Albumin: 3.4 g/dL — ABNORMAL LOW (ref 3.5–5.0)
Alkaline Phosphatase: 75 U/L (ref 38–126)
Bilirubin, Direct: 0.2 mg/dL (ref 0.0–0.2)
Indirect Bilirubin: 0.4 mg/dL (ref 0.3–0.9)
Total Bilirubin: 0.6 mg/dL (ref 0.3–1.2)
Total Protein: 7 g/dL (ref 6.5–8.1)

## 2021-02-02 LAB — BASIC METABOLIC PANEL
Anion gap: 9 (ref 5–15)
BUN: 10 mg/dL (ref 8–23)
CO2: 21 mmol/L — ABNORMAL LOW (ref 22–32)
Calcium: 9.2 mg/dL (ref 8.9–10.3)
Chloride: 109 mmol/L (ref 98–111)
Creatinine, Ser: 1.07 mg/dL — ABNORMAL HIGH (ref 0.44–1.00)
GFR, Estimated: 56 mL/min — ABNORMAL LOW (ref >60)
Glucose, Bld: 96 mg/dL (ref 70–99)
Potassium: 4 mmol/L (ref 3.5–5.1)
Sodium: 139 mmol/L (ref 135–145)

## 2021-02-02 LAB — CBC WITH DIFFERENTIAL/PLATELET
Abs Immature Granulocytes: 0.03 10*3/uL (ref 0.00–0.07)
Basophils Absolute: 0.1 10*3/uL (ref 0.0–0.1)
Basophils Relative: 0 %
Eosinophils Absolute: 0.1 10*3/uL (ref 0.0–0.5)
Eosinophils Relative: 0 %
HCT: 38.2 % (ref 36.0–46.0)
Hemoglobin: 12.3 g/dL (ref 12.0–15.0)
Immature Granulocytes: 0 %
Lymphocytes Relative: 9 %
Lymphs Abs: 1.1 10*3/uL (ref 0.7–4.0)
MCH: 28.6 pg (ref 26.0–34.0)
MCHC: 32.2 g/dL (ref 30.0–36.0)
MCV: 88.8 fL (ref 80.0–100.0)
Monocytes Absolute: 0.7 10*3/uL (ref 0.1–1.0)
Monocytes Relative: 6 %
Neutro Abs: 10.5 10*3/uL — ABNORMAL HIGH (ref 1.7–7.7)
Neutrophils Relative %: 85 %
Platelets: 135 10*3/uL — ABNORMAL LOW (ref 150–400)
RBC: 4.3 MIL/uL (ref 3.87–5.11)
RDW: 13.5 % (ref 11.5–15.5)
WBC: 12.4 10*3/uL — ABNORMAL HIGH (ref 4.0–10.5)
nRBC: 0 % (ref 0.0–0.2)

## 2021-02-02 MED ORDER — CEPHALEXIN 500 MG PO CAPS: 500.0000 mg | ORAL_CAPSULE | Freq: Two times a day (BID) | ORAL | Status: DC

## 2021-02-02 MED ORDER — SODIUM CHLORIDE 0.9 % IV SOLN
1.0000 g | Freq: Once | INTRAVENOUS | Status: AC
  Administered 2021-02-02: 1 g via INTRAVENOUS
  Filled 2021-02-02: qty 10

## 2021-02-02 MED ORDER — SULFAMETHOXAZOLE-TRIMETHOPRIM 800-160 MG PO TABS: 1.0000 | ORAL_TABLET | Freq: Two times a day (BID) | ORAL | Status: DC

## 2021-02-02 MED ORDER — MECLIZINE HCL 25 MG PO TABS
12.5000 mg | ORAL_TABLET | Freq: Two times a day (BID) | ORAL | Status: DC | PRN
  Administered 2021-02-02: 12.5 mg via ORAL
  Filled 2021-02-02: qty 1

## 2021-02-02 NOTE — Care Management Obs Status (Signed)
MEDICARE OBSERVATION STATUS NOTIFICATION   Patient Details  Name: Erika Case MRN: [id number] Date of Birth: [birth year]   Medicare Observation Status Notification Given:  Yes    Bess Kinds, RN 02/02/2021, 3:10 PM

## 2021-02-02 NOTE — Plan of Care (Signed)
  Problem: Education: Goal: Knowledge of General Education information will improve Description: Including pain rating scale, medication(s)/side effects and non-pharmacologic comfort measures Outcome: Completed/Met

## 2021-02-02 NOTE — TOC Initial Note (Signed)
Transition of Care [facility]) - Initial/Assessment Note    Patient Details  Name: Erika Case MRN: [id number] Date of Birth: 04/01/1951  [facility] CM/SW Contact:    Bess Kinds, RN Phone Number: [phone] 02/02/2021, 3:16 PM  Clinical Narrative:                  Spoke with patient at the bedside. PTA home alone. Takes care of herself. Stated that she is never sick. No TOC needs identified at this time.   Expected Discharge Plan: Home/Self Care Barriers to Discharge: Continued Medical Work up   Patient Goals and CMS Choice Patient states their goals for this hospitalization and ongoing recovery are:: return home CMS Medicare.gov Compare Post Acute Care list provided to:: Patient Choice offered to / list presented to : NA  Expected Discharge Plan and Services Expected Discharge Plan: Home/Self Care     Post Acute Care Choice: NA                   DME Arranged: N/A DME Agency: NA       HH Arranged: NA HH Agency: NA        Prior Living Arrangements/Services   Lives with:: Self Patient language and need for interpreter reviewed:: Yes Do you feel safe going back to the place where you live?: Yes      Need for Family Participation in Patient Care: No (Comment)     Criminal Activity/Legal Involvement Pertinent to Current Situation/Hospitalization: No - Comment as needed  Activities of Daily Living      Permission Sought/Granted                  Emotional Assessment Appearance:: Appears stated age Attitude/Demeanor/Rapport: Engaged Affect (typically observed): Accepting Orientation: : Oriented to Self,Oriented to  Time,Oriented to Place,Oriented to Situation Alcohol / Substance Use: Not Applicable Psych Involvement: No (comment)  Admission diagnosis:  Lactic acidosis [E87.2] Pyelonephritis [N12] Left lateral abdominal pain [R10.9] Acute pyelonephritis [N10] Nausea and vomiting, intractability of vomiting not specified, unspecified  vomiting type [R11.2] Patient Active Problem List   Diagnosis Date Noted  . Acute pyelonephritis 02/01/2021   PCP:  Patient, No Pcp Per (Inactive) Pharmacy:   [id number] [place], [place] - [address] AT [facility] [address] [place] [address] Phone: [phone] Fax: [phone]  [id number] - [address] ([place]), [place] - [address] [place] [address] Phone: [phone] Fax: [phone]     Social Determinants of Health (SDOH) Interventions    Readmission Risk Interventions No flowsheet data found.

## 2021-02-02 NOTE — Progress Notes (Signed)
   Subjective:   Ms. Kettering states she is having some dizziness and headache. Dizziness is present even at rest; sitting up seems to help. This was present yesterday when she first arrived to [facility]. She denies any additional nausea, vomiting. She is feeling thirsty and dehydrated. Overall, she doesn't feel well.   Objective:  Vital signs in last 24 hours: Vitals:   02/01/21 1550 02/01/21 1644 02/01/21 1900 02/02/21 0308  BP: (!) 149/85 (!) 151/84 (!) 144/83 (!) 141/83  Pulse: 94 97 94 92  Resp: 15 16 17 18   Temp: 98 F (36.7 C) 99.1 F (37.3 C) 100 F (37.8 C) 98.7 F (37.1 C)  TempSrc: Oral Oral Oral Oral  SpO2: 100% 94% 98% 100%  Weight:  81.8 kg     General: Obese elderly female lying in bed, NAD. Cardiovascular: Normal rate and regular rhythm, no murmurs rubs or gallops. Pulmonary: Clear to auscultation bilaterally. Abdomen: Soft, nondistended.  No abdominal tenderness to palpation.  Mild CVA tenderness on left side.  No guarding or rebound tenderness. Skin: Warm and dry Psych: Anxious mood.   Assessment/Plan:  Active Problems:   Acute pyelonephritis  AKI 2/2 mild L hydronephrosis - resolving Acute L pyelonephritis Exam grossly unchanged, still appears somewhat confused.  Continues to remain afebrile.  Developed a mild leukocytosis (12.4).  Lactate decreased to 2.1.  Preliminary urine culture showing gram-negative rods.  Kidney function improved from 1.33 > 1.07.  Will give 1 more dose of IV Rocephin.  May transition to p.o. Bactrim ds tablets twice daily from tomorrow for 10 days should patient's clinical status improve.  Continue pain control with Tylenol and Norco every 6 hours as needed.  Continue IV Zofran for nausea and vomiting. -Daily CBC, BMP  Dizziness Patient with history of vertigo.  We will add on meclizine 12.5 mg twice daily as needed.  Prior to Admission Living Arrangement: Home Anticipated Discharge Location: To be decided Barriers to Discharge: Continued  medical management Dispo: To be decided  , MD 02/02/2021, 6:16 AM Pager: [phone] After 5pm on weekdays and 1pm on weekends: On Call pager [phone]

## 2021-02-02 NOTE — Progress Notes (Signed)
Patient's son is currently at the bedside and has requested to speak with MD, MD has been notified and RN will continue to monitor the patient.

## 2021-02-03 DIAGNOSIS — G9341 Metabolic encephalopathy: Secondary | ICD-10-CM | POA: Diagnosis present

## 2021-02-03 DIAGNOSIS — B9620 Unspecified Escherichia coli [E. coli] as the cause of diseases classified elsewhere: Secondary | ICD-10-CM | POA: Diagnosis present

## 2021-02-03 DIAGNOSIS — I10 Essential (primary) hypertension: Secondary | ICD-10-CM | POA: Diagnosis present

## 2021-02-03 DIAGNOSIS — N179 Acute kidney failure, unspecified: Secondary | ICD-10-CM | POA: Diagnosis present

## 2021-02-03 DIAGNOSIS — N136 Pyonephrosis: Secondary | ICD-10-CM | POA: Diagnosis present

## 2021-02-03 DIAGNOSIS — Z20822 Contact with and (suspected) exposure to covid-19: Secondary | ICD-10-CM | POA: Diagnosis present

## 2021-02-03 DIAGNOSIS — E860 Dehydration: Secondary | ICD-10-CM | POA: Diagnosis present

## 2021-02-03 DIAGNOSIS — R42 Dizziness and giddiness: Secondary | ICD-10-CM | POA: Diagnosis present

## 2021-02-03 DIAGNOSIS — Z8249 Family history of ischemic heart disease and other diseases of the circulatory system: Secondary | ICD-10-CM | POA: Diagnosis not present

## 2021-02-03 DIAGNOSIS — Z6830 Body mass index (BMI) 30.0-30.9, adult: Secondary | ICD-10-CM | POA: Diagnosis not present

## 2021-02-03 DIAGNOSIS — E872 Acidosis: Secondary | ICD-10-CM | POA: Diagnosis present

## 2021-02-03 DIAGNOSIS — E876 Hypokalemia: Secondary | ICD-10-CM | POA: Diagnosis present

## 2021-02-03 DIAGNOSIS — N10 Acute tubulo-interstitial nephritis: Secondary | ICD-10-CM | POA: Diagnosis not present

## 2021-02-03 DIAGNOSIS — B951 Streptococcus, group B, as the cause of diseases classified elsewhere: Secondary | ICD-10-CM | POA: Diagnosis present

## 2021-02-03 DIAGNOSIS — E669 Obesity, unspecified: Secondary | ICD-10-CM | POA: Diagnosis present

## 2021-02-03 LAB — CBC WITH DIFFERENTIAL/PLATELET
Abs Immature Granulocytes: 0.04 10*3/uL (ref 0.00–0.07)
Basophils Absolute: 0.1 10*3/uL (ref 0.0–0.1)
Basophils Relative: 1 %
Eosinophils Absolute: 0.1 10*3/uL (ref 0.0–0.5)
Eosinophils Relative: 2 %
HCT: 36 % (ref 36.0–46.0)
Hemoglobin: 11.8 g/dL — ABNORMAL LOW (ref 12.0–15.0)
Immature Granulocytes: 0 %
Lymphocytes Relative: 19 %
Lymphs Abs: 1.7 10*3/uL (ref 0.7–4.0)
MCH: 28.5 pg (ref 26.0–34.0)
MCHC: 32.8 g/dL (ref 30.0–36.0)
MCV: 87 fL (ref 80.0–100.0)
Monocytes Absolute: 0.8 10*3/uL (ref 0.1–1.0)
Monocytes Relative: 9 %
Neutro Abs: 6.1 10*3/uL (ref 1.7–7.7)
Neutrophils Relative %: 69 %
Platelets: 199 10*3/uL (ref 150–400)
RBC: 4.14 MIL/uL (ref 3.87–5.11)
RDW: 13.2 % (ref 11.5–15.5)
WBC: 8.9 10*3/uL (ref 4.0–10.5)
nRBC: 0 % (ref 0.0–0.2)

## 2021-02-03 LAB — BASIC METABOLIC PANEL
Anion gap: 9 (ref 5–15)
BUN: 10 mg/dL (ref 8–23)
CO2: 23 mmol/L (ref 22–32)
Calcium: 9.1 mg/dL (ref 8.9–10.3)
Chloride: 104 mmol/L (ref 98–111)
Creatinine, Ser: 1.11 mg/dL — ABNORMAL HIGH (ref 0.44–1.00)
GFR, Estimated: 54 mL/min — ABNORMAL LOW (ref >60)
Glucose, Bld: 85 mg/dL (ref 70–99)
Potassium: 3.4 mmol/L — ABNORMAL LOW (ref 3.5–5.1)
Sodium: 136 mmol/L (ref 135–145)

## 2021-02-03 LAB — URINE CULTURE: Culture: >=100000 — AB

## 2021-02-03 MED ORDER — CEFDINIR 300 MG PO CAPS
300.0000 mg | ORAL_CAPSULE | Freq: Two times a day (BID) | ORAL | Status: DC
  Administered 2021-02-03 – 2021-02-04 (×3): 300 mg via ORAL
  Filled 2021-02-03 (×5): qty 1

## 2021-02-03 MED ORDER — POTASSIUM CHLORIDE CRYS ER 20 MEQ PO TBCR
40.0000 meq | EXTENDED_RELEASE_TABLET | Freq: Two times a day (BID) | ORAL | Status: DC
  Administered 2021-02-03 – 2021-02-04 (×3): 40 meq via ORAL
  Filled 2021-02-03 (×3): qty 2

## 2021-02-03 NOTE — Evaluation (Signed)
Physical Therapy Evaluation/Vestibular Assessment Patient Details Name: Erika Case MRN: [id number] DOB: Jun 28, 1951 Today's Date: 02/03/2021   History of Present Illness  70 y.o. female admitted on 02/01/21 forabdominal pain, dx with acute pyelonephritis, urine culture growing E coli and group B strep, AKI, and dizziness.  Pt with significant PMH of HTN.  Clinical Impression  Limited mobility and vestibular assessment.  Pt is self limiting with low tolerance of mobility to EOB even.  From what limited vestibular assessment I could do, she did not have any signs of vestibular pathology.  Tracking was normal, transitional moves up and down did not produce spinning or nystagmus.  She reports constant, non-changing with positional changes dizziness.  I do not believe this is vertigo or BPPV, however, a more thorough assessment would be needed to completely rule out.  She was Kundinger to stand and side step without associated sway or imbalance, however, did not want to do more despite encouragement due to, "not feeling well".  She reports her son can provide intermittent check ins, but works, so cannot be there all the time. She also works 2 days per week and has already told work she would not be in this week.   PT to follow acutely for deficits listed below.      Follow Up Recommendations Home health PT    Equipment Recommendations  Rolling walker with 5" wheels    Recommendations for Other Services       Precautions / Restrictions        Mobility  Bed Mobility Overal bed mobility: Modified Independent                  Transfers Overall transfer level: Needs assistance   Transfers: Sit to/from Stand Sit to Stand: Supervision         General transfer comment: close supervision for safety  Ambulation/Gait Ambulation/Gait assistance: Min guard Gait Distance (Feet): 2 Feet Assistive device: 1 person hand held assist Gait Pattern/deviations: Step-to pattern     General Gait  Details: side stepped up to [facility] x 2 steps "that is all I can do!"  Stairs            Wheelchair Mobility    Modified Rankin (Stroke Patients Only)       Balance Overall balance assessment: Needs assistance Sitting-balance support: Feet supported;No upper extremity supported Sitting balance-Leahy Scale: Good Sitting balance - Comments: pt supervision in sitting, sat up twice, but kept laying back down reporting she did not feel well, spinning and lightheaded   Standing balance support: Single extremity supported Standing balance-Leahy Scale: Poor Standing balance comment: min guard assist in standing for balance, but no obvious sway or tip as I would expect with someone who is vertiginous.       02/03/21 0001  Vestibular Assessment  General Observation eyes closed room dark in fetal position, emesis bag on bedside table.  Symptom Behavior  Subjective history of current problem reports dizziness, spinning, since admission  Type of Dizziness  Spinning;Lightheadedness  Frequency of Dizziness constant even with eyes closed  Duration of Dizziness long  Symptom Nature Constant  Aggravating Factors Not applicable  Relieving Factors Not applicable  Progression of Symptoms No change since onset  History of similar episodes per pt report none  Oculomotor Exam  Oculomotor Alignment Normal  Gaze-induced  Absent  Smooth Pursuits Intact  Vestibulo-Ocular Reflex  VOR 1 Head Only (x 1 viewing) symptomatic vertical and horizontal  Positional Testing  Horizontal Canal Testing Horizontal Canal  Right  Horizontal Canal Right  Horizontal Canal Right Duration 0  Horizontal Canal Right Symptoms Normal                           Pertinent Vitals/Pain Pain Assessment: No/denies pain    Home Living Family/patient expects to be discharged to:: Private residence Living Arrangements: Alone Available Help at Discharge: Family;Available PRN/intermittently (son) Type of Home:  House       Home Layout: One level Home Equipment: Shower seat;Grab bars - toilet;Grab bars - tub/shower      Prior Function Level of Independence: Independent         Comments: does not drive, works 2 days per week     Hand Dominance   Dominant Hand: Right    Extremity/Trunk Assessment   Upper Extremity Assessment Upper Extremity Assessment: Overall WFL for tasks assessed    Lower Extremity Assessment Lower Extremity Assessment: Generalized weakness    Cervical / Trunk Assessment Cervical / Trunk Assessment: Normal  Communication   Communication: No difficulties  Cognition Arousal/Alertness: Awake/alert Behavior During Therapy: WFL for tasks assessed/performed Overall Cognitive Status: Within Functional Limits for tasks assessed                                        General Comments      Exercises     Assessment/Plan    PT Assessment Patient needs continued PT services  PT Problem List Decreased strength;Decreased activity tolerance;Decreased balance;Decreased mobility;Decreased knowledge of use of DME       PT Treatment Interventions DME instruction;Gait training;Stair training;Balance training;Functional mobility training;Therapeutic activities;Therapeutic exercise;Neuromuscular re-education;Patient/family education    PT Goals (Current goals can be found in the Care Plan section)  Acute Rehab PT Goals Patient Stated Goal: to feel better before going home PT Goal Formulation: With patient Time For Goal Achievement: 02/17/21 Potential to Achieve Goals: Good    Frequency Min 3X/week   Barriers to discharge        Co-evaluation               AM-PAC PT "6 Clicks" Mobility  Outcome Measure Help needed turning from your back to your side while in a flat bed without using bedrails?: None Help needed moving from lying on your back to sitting on the side of a flat bed without using bedrails?: None Help needed moving to and  from a bed to a chair (including a wheelchair)?: A Little Help needed standing up from a chair using your arms (e.g., wheelchair or bedside chair)?: A Little Help needed to walk in hospital room?: A Little Help needed climbing 3-5 steps with a railing? : A Little 6 Click Score: 20    End of Session   Activity Tolerance: Other (comment) (self limiting, "not feeling well") Patient left: in bed;with call bell/phone within reach;with bed alarm set Nurse Communication: Mobility status PT Visit Diagnosis: Difficulty in walking, not elsewhere classified (R26.2)    Time: 2458-0998 PT Time Calculation (min) (ACUTE ONLY): 12 min   Charges:   PT Evaluation $PT Eval Low Complexity: 1 Low          Corinna Capra, PT, DPT  Acute Rehabilitation [phone] pager [phone]) [phone] office

## 2021-02-03 NOTE — Discharge Summary (Addendum)
Name: Erika Case MRN: [id number] DOB: [birth year] 70 y.o. PCP: Patient, No Pcp Per (Inactive)  Date of Admission: 02/01/2021  7:21 AM Date of Discharge:  02/03/2021 Attending Physician: Reymundo Poll, MD  Discharge Diagnosis: 1. Left sided pyelonephritis  2. Acute kidney injury - resolved  Discharge Medications: Allergies as of 02/04/2021   No Known Allergies      Medication List     STOP taking these medications    magic mouthwash w/lidocaine Soln   traMADol 50 MG tablet Commonly known as: ULTRAM       TAKE these medications    acetaminophen 325 MG tablet Commonly known as: TYLENOL Take 325 mg by mouth at bedtime as needed (Sleep). Tylenol PM.   cefdinir 300 MG capsule Commonly known as: OMNICEF Take 1 capsule (300 mg total) by mouth every 12 (twelve) hours. Take 1 capsule by mouth twice daily (once in the morning and once at night).   docusate sodium 100 MG capsule Commonly known as: COLACE Take 100 mg by mouth daily as needed for mild constipation.               Durable Medical Equipment  (From admission, onward)           Start     Ordered   02/04/21 1324  For home use only DME Walker rolling  Once       Question Answer Comment  Walker: With 5 Inch Wheels   Patient needs a walker to treat with the following condition Decreased ambulation status      02/04/21 1323            Disposition and follow-up:   Ms.Erika Case was discharged from [facility] in Stable condition.  At the hospital follow up visit please address:  1. Left sided pyelonephritis. Urine cultures showing E coli and Group B Strep. Received 2 days of IV rocephin and 1 day of cefdinir BID. Will discharge with 7 days of PO cefdinir 300mg  BID to complete total 10-day course.  Also discharged with home health physical therapy and rolling walker with 5 inch wheels.  F/u with PCP in 1-2 weeks.  2.  Labs / imaging needed at time of follow-up: CBC, BMP  3.   Pending labs/ test needing follow-up: None  Follow-up Appointments:  Follow-up Information     Medicine, [facility] up.   Specialty: Family Medicine Why: Please go by the office this week and get a new patient packet and they will schedule you an appointment. Contact information: [address] [place] [phone]                 Hospital Course by problem list: 1. Left sided pyelonephritis.  Patient coming in with left-sided abdominal pain, nausea, vomiting, and cognitive confusion.  Urinalysis showing microhematuria and pyuria with many bacteria.  She had tenderness of left abdomen and left CVA. CT abdomen pelvis showing perinephric stranding and mild left-sided hydronephrosis without obstruction.  CT abdomen pelvis was concerning for acute left-sided pyelonephritis.  She developed a new leukocytosis the morning after admission although she remained afebrile throughout admission.  She received IV fluids, pain control with Tylenol and Norco every 6 hours as needed, and was started on IV Rocephin.  She received 2 days of IV Rocephin and then was switched to cefdinir 300 mg twice daily for 1 day after urine cultures showed E. coli and group B streptococcus. At this time she has  no tenderness on abdominal exam and she is alert and oriented x3.  She has not had any nausea and vomiting since admission.  Will discharge with 7-day course of cefdinir 300 mg twice daily to complete a total 10-day course of antibiotic therapy for acute pyelonephritis.  She will need to follow-up with PCP in 1 to 2 weeks for further evaluation of resolution of her symptoms.  She will be discharged with home health PT and rolling walker with 5 inch wheels.  Acute kidney injury secondary to left-sided hydronephrosis - resolved.  Baseline creatinine about 1, on admission creatinine 1.33.  CT abdomen pelvis showing mild left-sided hydronephrosis likely secondary to left-sided  pyelonephritis.  She received 2 L normal saline boluses for fluid rehydration with resultant improvement in creatinine to 1.07.  Remained stable on day of discharge.  Dizziness.  Patient was complaining of dizziness since yesterday.  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  She she received vestibular rehabilitation with physical therapy who found no vestibular abnormalities or evidence of BPPV on their exam.  Her dizziness has resolved today and she reports feeling good.  Outpatient follow up.   Discharge Exam:   BP (!) 168/84 (BP Location: Left Arm)   Pulse 73   Temp 98.1 F (36.7 C)   Resp 18   Ht  (1.651 m) Comment: per patient  Wt 81.8 kg   SpO2 100%   BMI 30.01 kg/m  Discharge exam:  General: Obese elderly female lying in bed, no acute distress. Cardiovascular: Normal rate and regular rhythm, no murmurs rubs or gallops. Pulmonary: Clear to auscultation bilaterally, no adventitious sounds noted, respiratory effort is normal. Abdomen: Soft, nondistended, nontender.  Normoactive bowel sounds.  No CVA tenderness noted. Skin: Warm and dry. Neuro: AAO x3, no focal deficits noted.  Pertinent Labs, Studies, and Procedures:  CBC Latest Ref Rng & Units 02/04/2021 02/03/2021 02/02/2021  WBC 4.0 - 10.5 K/uL 7.1 8.9 12.4(H)  Hemoglobin 12.0 - 15.0 g/dL 11.6(L) 11.8(L) 12.3  Hematocrit 36.0 - 46.0 % 36.8 36.0 38.2  Platelets 150 - 400 K/uL 218 199 135(L)   CMP Latest Ref Rng & Units 02/04/2021 02/03/2021 02/02/2021  Glucose 70 - 99 mg/dL 87 85 96  BUN 8 - 23 mg/dL Creatinine 0.44 - 1.00 mg/dL 1.61(W) 9.60(A) 5.40(J)  Sodium 135 - 145 mmol/L 136 136 139  Potassium 3.5 - 5.1 mmol/L 4.2 3.4(L) 4.0  Chloride 98 - 111 mmol/L 103 104 109  CO2 22 - 32 mmol/L 26 23 21(L)  Calcium 8.9 - 10.3 mg/dL 9.4 9.1 9.2  Total Protein 6.5 - 8.1 g/dL - - 7.0  Total Bilirubin 0.3 - 1.2 mg/dL - - 0.6  Alkaline Phos 38 - 126 U/L - - 75  AST 15 - 41 U/L - - 26  ALT 0 - 44 U/L - - 17   Lactic Acid,  Venous    Component Value Date/Time   LATICACIDVEN 2.1 (HH) 02/01/2021 1732   Blood Culture    Component Value Date/Time   SDES BLOOD RIGHT HAND 02/02/2021 1137   SPECREQUEST  02/02/2021 1137    BOTTLES DRAWN AEROBIC ONLY Blood Culture results may not be optimal due to an inadequate volume of blood received in culture bottles   CULT  02/02/2021 1137    NO GROWTH 2 DAYS Performed at [facility], [address] [place], [place] [address]    REPTSTATUS PENDING 02/02/2021 1137   Urinalysis    Component  Value Date/Time   COLORURINE YELLOW 02/01/2021 0955   APPEARANCEUR HAZY (A) 02/01/2021 0955   LABSPEC 1.005 02/01/2021 0955   PHURINE 7.0 02/01/2021 0955   GLUCOSEU NEGATIVE 02/01/2021 0955   HGBUR MODERATE (A) 02/01/2021 0955   BILIRUBINUR NEGATIVE 02/01/2021 0955   KETONESUR NEGATIVE 02/01/2021 0955   PROTEINUR NEGATIVE 02/01/2021 0955   UROBILINOGEN 0.2 11/01/2014 1638   NITRITE NEGATIVE 02/01/2021 0955   LEUKOCYTESUR MODERATE (A) 02/01/2021 0955   Urine Cultures: >=100,000 COLONIES/mL ESCHERICHIA COLI  50,000 COLONIES/mL GROUP B STREP (S.AGALACTIAE) ISOLATED  Escherichia coli    MIC    AMPICILLIN >=32 RESIST... Resistant    AMPICILLIN/SULBACTAM >=32 RESIST... Resistant    CEFAZOLIN 16 SENSITIVE  Sensitive    CEFEPIME <=0.12 SENS... Sensitive    CEFTRIAXONE <=0.25 SENS... Sensitive    CIPROFLOXACIN <=0.25 SENS... Sensitive    GENTAMICIN <=1 SENSITIVE  Sensitive    IMIPENEM <=0.25 SENS... Sensitive    NITROFURANTOIN <=16 SENSIT... Sensitive    PIP/TAZO <=4 SENSITIVE  Sensitive    TRIMETH/SULFA <=20 SENSIT... Sensitive      Discharge Instructions: Discharge Instructions     Call MD for:  difficulty breathing, headache or visual disturbances   Complete by: As directed    Call MD for:  extreme fatigue   Complete by: As directed    Call MD for:  persistant dizziness or light-headedness   Complete by: As directed    Call MD for:  persistant nausea and  vomiting   Complete by: As directed    Call MD for:  redness, tenderness, or signs of infection (pain, swelling, redness, odor or green/yellow discharge around incision site)   Complete by: As directed    Call MD for:  severe uncontrolled pain   Complete by: As directed    Call MD for:  temperature >100.4   Complete by: As directed    Diet - low sodium heart healthy   Complete by: As directed    Discharge instructions   Complete by: As directed    Ms. Hasten, it was a pleasure taking care of you during your stay here.  You came in with an infection of your bladder and kidney and were treated for it with antibiotics.  You received 3 days of antibiotics, but you have 7 more days left to complete at home.  Please take note of the following:  1.  Please take your prescribed antibiotic (cefdinir) twice daily for the next 7 days.  You have 1 dose left for today and then you will have 6 more days where you will have to take it twice daily.  It is very important that you complete all of your antibiotic therapy or else your infection could come back.  2.  You will be sent home with home health services and physical therapy will come help you at home.   Increase activity slowly   Complete by: As directed        Signed: Merrilyn Puma, MD 02/04/2021, 1:37 PM   Pager: [phone]

## 2021-02-03 NOTE — Progress Notes (Addendum)
   Subjective:   Patient continues to endorse dizziness that does change with positional changes. She denies any urinary symptoms but did have some chills overnight.  Denies having any abdominal pain.  Tried to call son, Ethelene Browns, x2 but this appears to be the wrong number for him.  Objective:  Vital signs in last 24 hours: Vitals:   02/02/21 1300 02/02/21 1808 02/02/21 2012 02/03/21 0507  BP:  (!) 144/73 (!) 155/77 (!) 146/83  Pulse:  77 86 95  Resp:  20 20 18   Temp:  98.9 F (37.2 C) 98.7 F (37.1 C) 98.2 F (36.8 C)  TempSrc:   Oral Oral  SpO2:  99% 98% 99%  Weight:      Height: 5\' 5"  (1.651 m)      General: Obese elderly female lying in bed, NAD. Cardiovascular: Normal rate and regular rhythm, no murmurs rubs or gallops. Pulmonary: Clear to auscultation bilaterally, no adventitious sounds noted. Abdomen: Soft, nondistended, nontender.  Normoactive bowel sounds. Skin: Warm and dry. Neuro: AAOx3, no focal deficits noted.   Assessment/Plan:  Active Problems:   Acute pyelonephritis  AKI - resolved Acute L pyelonephritis No abdominal pain on exam and she is oriented to name, place, and what she is being treated for during her stay here.  Continues to remain afebrile.  Leukocytosis has resolved.  Creatinine stable.  Her urine cultures are showing E. coli and group B strep.  She is received 2 doses of IV Rocephin. - Switch to p.o. cefdinir 300 mg twice daily for 8 days to complete total 10-day course - Pain control with Tylenol and Norco every 6 hours as needed - Zofran for nausea and vomiting - Blood cultures pending but no growth in past 24 hours - Daily CBC, BMP - Need to obtain son's correct contact information for dispo planning  Dizziness Patient with history of BPPV.  Continues to endorse dizziness today on exam.  Will provide vestibular rehab via PT.  Prior to Admission Living Arrangement: Home Anticipated Discharge Location: To be decided Barriers to  Discharge: Continued medical management Dispo: To be decided  , MD 02/03/2021, 6:33 AM Pager: [phone] After 5pm on weekdays and 1pm on weekends: On Call pager [phone]

## 2021-02-03 NOTE — Plan of Care (Signed)
?  Problem: Clinical Measurements: ?Goal: Will remain free from infection ?Outcome: Progressing ?  ?

## 2021-02-03 NOTE — TOC Progression Note (Addendum)
Transition of Care [facility]) - Progression Note    Patient Details  Name: Erika Case MRN: [id number] Date of Birth: [birth year]  Transition of Care [facility]) CM/SW Contact  Kermit Balo, RN Phone Number: 02/03/2021, 2:34 PM  Clinical Narrative:    Recommendation for home health services. Pt has no preference. HH arranged with [facility]. Pt has no DME at home. She will need walker ordered prior to d/c.  TOC following.  1530: pt without a PCP. She was agreeable to CM assisting her to find one. [facility] on [place] will see her once she comes to the office and fills out a new patient packet. Information on the AVS. CM has asked [facility] to fill in for [facility] orders as needed until she sees her PcP.    Expected Discharge Plan: Home w Home Health Services Barriers to Discharge: Continued Medical Work up  Expected Discharge Plan and Services Expected Discharge Plan: Home w Home Health Services   Discharge Planning Services: CM Consult Post Acute Care Choice: Home Health,Durable Medical Equipment Living arrangements for the past 2 months: Single Family Home                 DME Arranged: Walker rolling DME Agency: NA       HH Arranged: PT HH Agency: [facility]         Social Determinants of Health (SDOH) Interventions    Readmission Risk Interventions No flowsheet data found.

## 2021-02-04 ENCOUNTER — Other Ambulatory Visit (HOSPITAL_COMMUNITY): Payer: Self-pay

## 2021-02-04 ENCOUNTER — Telehealth: Payer: Self-pay

## 2021-02-04 DIAGNOSIS — N10 Acute tubulo-interstitial nephritis: Secondary | ICD-10-CM | POA: Diagnosis not present

## 2021-02-04 LAB — CBC
HCT: 36.8 % (ref 36.0–46.0)
Hemoglobin: 11.6 g/dL — ABNORMAL LOW (ref 12.0–15.0)
MCH: 27.6 pg (ref 26.0–34.0)
MCHC: 31.5 g/dL (ref 30.0–36.0)
MCV: 87.6 fL (ref 80.0–100.0)
Platelets: 218 10*3/uL (ref 150–400)
RBC: 4.2 MIL/uL (ref 3.87–5.11)
RDW: 13.2 % (ref 11.5–15.5)
WBC: 7.1 10*3/uL (ref 4.0–10.5)
nRBC: 0 % (ref 0.0–0.2)

## 2021-02-04 LAB — BASIC METABOLIC PANEL
Anion gap: 7 (ref 5–15)
BUN: 9 mg/dL (ref 8–23)
CO2: 26 mmol/L (ref 22–32)
Calcium: 9.4 mg/dL (ref 8.9–10.3)
Chloride: 103 mmol/L (ref 98–111)
Creatinine, Ser: 1.14 mg/dL — ABNORMAL HIGH (ref 0.44–1.00)
GFR, Estimated: 52 mL/min — ABNORMAL LOW (ref >60)
Glucose, Bld: 87 mg/dL (ref 70–99)
Potassium: 4.2 mmol/L (ref 3.5–5.1)
Sodium: 136 mmol/L (ref 135–145)

## 2021-02-04 MED ORDER — CEFDINIR 300 MG PO CAPS
300.0000 mg | ORAL_CAPSULE | Freq: Two times a day (BID) | ORAL | 0 refills | Status: DC
  Filled 2021-02-04: qty 13, 7d supply, fill #0

## 2021-02-04 NOTE — Telephone Encounter (Signed)
Clydie Braun with [facility] want to confirmed the doctor who will signed on home health orders. Please call back.

## 2021-02-04 NOTE — Telephone Encounter (Signed)
I can sign

## 2021-02-04 NOTE — Progress Notes (Signed)
DISCHARGE NOTE HOME Erika Case to be discharged Home per MD order. Discussed prescriptions and follow up appointments with the patient. Prescriptions given to patient; medication list explained in detail. Patient verbalized understanding.  Skin clean, dry and intact without evidence of skin break down, no evidence of skin tears noted. IV catheter discontinued intact. Site without signs and symptoms of complications. Dressing and pressure applied. Pt denies pain at the site currently. No complaints noted.  Patient free of lines, drains, and wounds.   An After Visit Summary (AVS) was printed and given to the patient. Patient escorted via wheelchair, and discharged home via private auto.  Myrtis Hopping, RN

## 2021-02-04 NOTE — TOC Transition Note (Signed)
Transition of Care [facility]) - CM/SW Discharge Note   Patient Details  Name: Erika Case MRN: [id number] Date of Birth: [birth year]  Transition of Care [facility]) CM/SW Contact:  Bess Kinds, RN Phone Number: [phone] 02/04/2021, 3:05 PM   Clinical Narrative:     Spoke with patient at the bedside. Advised of home health follow up through [facility] - agreeable. [facility] to follow up with patient tomorrow. Discussed DME RW - denied need stating she already has something like that. Patient to follow up with new PCP - advised patient of information on AVS. IM clinic to sign additional orders/plan of care for for [facility] agency pending patient set up with PCP. Patient has arranged a ride home from hospital. No further TOC needs identified at this time.   Final next level of care: Home w Home Health Services Barriers to Discharge: No Barriers Identified   Patient Goals and CMS Choice Patient states their goals for this hospitalization and ongoing recovery are:: return home CMS Medicare.gov Compare Post Acute Care list provided to:: Patient Choice offered to / list presented to : Patient  Discharge Placement                       Discharge Plan and Services   Discharge Planning Services: CM Consult Post Acute Care Choice: Home Health,Durable Medical Equipment          DME Arranged: N/A (declined walker today) DME Agency: NA       HH Arranged: PT HH Agency: [facility] Date HH Agency Contacted: 02/04/21 Time HH Agency Contacted: 1505 Representative spoke with at [facility] Agency: Amy  Social Determinants of Health (SDOH) Interventions     Readmission Risk Interventions No flowsheet data found.

## 2021-02-04 NOTE — Telephone Encounter (Signed)
Return call to [facility] to inform her Dr Antony Contras has agreed to sign HH orders.

## 2021-02-05 ENCOUNTER — Telehealth: Payer: Self-pay

## 2021-02-05 NOTE — Telephone Encounter (Signed)
Return call to [place] [facility], [facility] - stated pt refused PT eval stating she moving around just fine and does not need PT. Call from [place] just to make Dr Antony Contras aware.

## 2021-02-05 NOTE — Telephone Encounter (Signed)
[facility] is requesting a call back .. he stated that the pt is a dr Antony Contras pt .. and that she has declined HHA care

## 2021-02-06 NOTE — Telephone Encounter (Signed)
Ok thank you 

## 2021-02-07 LAB — CULTURE, BLOOD (ROUTINE X 2)
Culture: NO GROWTH
Culture: NO GROWTH

## 2021-05-22 ENCOUNTER — Ambulatory Visit: Payer: Medicaid Other | Admitting: Internal Medicine

## 2021-07-01 ENCOUNTER — Ambulatory Visit: Payer: Medicaid Other | Admitting: Critical Care Medicine

## 2021-07-30 ENCOUNTER — Ambulatory Visit (INDEPENDENT_AMBULATORY_CARE_PROVIDER_SITE_OTHER): Payer: Medicaid Other | Admitting: Primary Care

## 2021-07-31 ENCOUNTER — Ambulatory Visit: Payer: Medicare Other | Attending: Physician Assistant | Admitting: Physician Assistant

## 2021-07-31 ENCOUNTER — Other Ambulatory Visit: Payer: Self-pay

## 2021-07-31 ENCOUNTER — Encounter: Payer: Self-pay | Admitting: Physician Assistant

## 2021-07-31 VITALS — BP 161/94 | HR 78 | Temp 98.7°F | Resp 18 | Ht 66.0 in | Wt 179.0 lb

## 2021-07-31 DIAGNOSIS — R030 Elevated blood-pressure reading, without diagnosis of hypertension: Secondary | ICD-10-CM | POA: Diagnosis not present

## 2021-07-31 DIAGNOSIS — Z1159 Encounter for screening for other viral diseases: Secondary | ICD-10-CM

## 2021-07-31 DIAGNOSIS — G4700 Insomnia, unspecified: Secondary | ICD-10-CM | POA: Insufficient documentation

## 2021-07-31 DIAGNOSIS — N3000 Acute cystitis without hematuria: Secondary | ICD-10-CM

## 2021-07-31 DIAGNOSIS — R4589 Other symptoms and signs involving emotional state: Secondary | ICD-10-CM | POA: Insufficient documentation

## 2021-07-31 DIAGNOSIS — Z13220 Encounter for screening for lipoid disorders: Secondary | ICD-10-CM

## 2021-07-31 DIAGNOSIS — Z1231 Encounter for screening mammogram for malignant neoplasm of breast: Secondary | ICD-10-CM

## 2021-07-31 DIAGNOSIS — R42 Dizziness and giddiness: Secondary | ICD-10-CM | POA: Diagnosis not present

## 2021-07-31 DIAGNOSIS — F418 Other specified anxiety disorders: Secondary | ICD-10-CM

## 2021-07-31 DIAGNOSIS — G629 Polyneuropathy, unspecified: Secondary | ICD-10-CM

## 2021-07-31 DIAGNOSIS — H9312 Tinnitus, left ear: Secondary | ICD-10-CM | POA: Diagnosis not present

## 2021-07-31 DIAGNOSIS — Z87448 Personal history of other diseases of urinary system: Secondary | ICD-10-CM

## 2021-07-31 LAB — POCT URINALYSIS DIP (CLINITEK)
Bilirubin, UA: NEGATIVE
Blood, UA: NEGATIVE
Glucose, UA: NEGATIVE mg/dL
Ketones, POC UA: NEGATIVE mg/dL
Nitrite, UA: NEGATIVE
POC PROTEIN,UA: NEGATIVE
Spec Grav, UA: <=1.005 — AB (ref 1.010–1.025)
Urobilinogen, UA: 0.2 E.U./dL
pH, UA: 6 (ref 5.0–8.0)

## 2021-07-31 MED ORDER — HYDROXYZINE HCL 25 MG PO TABS: 25.0000 mg | ORAL_TABLET | Freq: Every evening | ORAL | 0 refills | Status: DC | PRN

## 2021-07-31 MED ORDER — GABAPENTIN 100 MG PO CAPS: 100.0000 mg | ORAL_CAPSULE | Freq: Every day | ORAL | 0 refills | Status: DC

## 2021-07-31 NOTE — Progress Notes (Signed)
New Patient Office Visit  Subjective:  Patient ID: Erika Case, female    DOB: 09/17/1951  Age: 70 y.o. MRN: [id number]  CC:  Chief Complaint  Patient presents with   Establish Care    HPI Erika Case presents to establish care with several complaints.  Reports that she has been having a constant ringing in her left ear for the past month, notices it more at night or when it is quiet.  Does endorse that she takes Tylenol PM at night to "relax and go to sleep ".  States that she has been having numbness and tingling in her left foot for the past 3 -4 months, does feel it travel up towards her knee.  States that it is worse while she is in bed.  Denies injury or trauma, has not tried anything for relief.  States that she has been having dizziness almost every morning for the past couple of weeks,feels like she is spinning.  States that it happens when she first gets up.  States it goes away on its own.  Denies nausea or vomiting or headaches when this occurs.  States that she was treated for HTN when she was much younger.  States that she thinks it was due to drinking alcohol and that she use to drink a lot of alcohol but has not drank in the past 30 years.  States that has not had medical care to know what her BP has been.  Does not check her blood pressure at home.  States that she was treated for a UTI in 01/2021, states that she was hospitalized and unfortunately misplaced the medication she needed to finish after her hospitalization.  States that she still feels like her urine will smell strong and seem cloudy.  Denies dysuria, suprapubic pain.  States that she has been drinking cranberry juice.  States that she drinks approximately 1 bottle of water a day    Past Medical History:  Diagnosis Date   Hypertension     History reviewed. No pertinent surgical history.  History reviewed. No pertinent family history.  Social History   Socioeconomic History   Marital status: Single     Spouse name: Not on file   Number of children: Not on file   Years of education: Not on file   Highest education level: Not on file  Occupational History   Not on file  Tobacco Use   Smoking status: Never   Smokeless tobacco: Never  Substance and Sexual Activity   Alcohol use: No   Drug use: No   Sexual activity: Not Currently  Other Topics Concern   Not on file  Social History Narrative   Not on file   Social Determinants of Health   Financial Resource Strain: Not on file  Food Insecurity: Not on file  Transportation Needs: Not on file  Physical Activity: Not on file  Stress: Not on file  Social Connections: Not on file  Intimate Partner Violence: Not on file    ROS Review of Systems  Constitutional:  Negative for chills and fever.  HENT:  Positive for tinnitus. Negative for ear discharge, ear pain and hearing loss.   Eyes: Negative.   Respiratory:  Negative for shortness of breath.   Cardiovascular:  Negative for chest pain.  Gastrointestinal: Negative.   Endocrine: Negative.   Genitourinary:  Negative for dysuria, urgency and vaginal discharge.  Musculoskeletal: Negative.   Skin: Negative.   Allergic/Immunologic: Negative.   Neurological:  Positive for  dizziness. Negative for weakness, numbness and headaches.  Hematological: Negative.   Psychiatric/Behavioral:  Positive for sleep disturbance. Negative for self-injury and suicidal ideas. The patient is nervous/anxious.    Objective:   Today's Vitals: BP (!) 161/94 (BP Location: Left Arm, Patient Position: Sitting, Cuff Size: Large)   Pulse 78   Temp 98.7 F (37.1 C) (Oral)   Resp 18   Ht 5\' 6"  (1.676 m)   Wt 179 lb (81.2 kg)   SpO2 99%   BMI 28.89 kg/m   Physical Exam Vitals and nursing note reviewed.  Constitutional:      Appearance: Normal appearance.  HENT:     Head: Normocephalic and atraumatic.     Right Ear: Tympanic membrane, ear canal and external ear normal.     Left Ear: Tympanic membrane,  ear canal and external ear normal.     Nose: Nose normal.     Mouth/Throat:     Mouth: Mucous membranes are moist.     Pharynx: Oropharynx is clear.  Eyes:     Extraocular Movements: Extraocular movements intact.     Conjunctiva/sclera: Conjunctivae normal.     Pupils: Pupils are equal, round, and reactive to light.  Cardiovascular:     Rate and Rhythm: Normal rate and regular rhythm.     Pulses: Normal pulses.     Heart sounds: Normal heart sounds.  Pulmonary:     Effort: Pulmonary effort is normal.     Breath sounds: Normal breath sounds.  Musculoskeletal:        General: Normal range of motion.     Cervical back: Normal range of motion and neck supple.  Skin:    General: Skin is warm and dry.  Neurological:     General: No focal deficit present.     Mental Status: She is alert and oriented to person, place, and time.  Psychiatric:        Mood and Affect: Mood normal.        Behavior: Behavior normal.        Thought Content: Thought content normal.        Judgment: Judgment normal.    Assessment & Plan:   Problem List Items Addressed This Visit       Nervous and Auditory   Neuropathy   Relevant Medications   gabapentin (NEURONTIN) 100 MG capsule     Genitourinary   Acute pyelonephritis     Other   Dizziness - Primary   Relevant Orders   CBC with Differential/Platelet   Comp. Metabolic Panel (12)   Elevated blood pressure reading in office without diagnosis of hypertension   Tinnitus of left ear   Insomnia   Relevant Medications   hydrOXYzine (ATARAX/VISTARIL) 25 MG tablet   Anxiety about health   Relevant Medications   hydrOXYzine (ATARAX/VISTARIL) 25 MG tablet   Other Visit Diagnoses     Screening, lipid       Relevant Orders   Lipid panel   Encounter for screening mammogram for malignant neoplasm of breast       Relevant Orders   MM DIGITAL SCREENING BILATERAL   Encounter for HCV screening test for low risk patient       Relevant Orders   HCV Ab  w Reflex to Quant PCR       Outpatient Encounter Medications as of 07/31/2021  Medication Sig   acetaminophen (TYLENOL) 325 MG tablet Take 325 mg by mouth at bedtime as needed (Sleep). Tylenol PM.   gabapentin (  NEURONTIN) 100 MG capsule Take 1 capsule (100 mg total) by mouth at bedtime.   hydrOXYzine (ATARAX/VISTARIL) 25 MG tablet Take 1 tablet (25 mg total) by mouth at bedtime as needed.   [DISCONTINUED] cefdinir (OMNICEF) 300 MG capsule Take 1 capsule (300 mg total) by mouth every 12 (twelve) hours. Take 1 capsule by mouth twice daily (once in the morning and once at night). (Patient not taking: Reported on 07/31/2021)   [DISCONTINUED] docusate sodium (COLACE) 100 MG capsule Take 100 mg by mouth daily as needed for mild constipation.   No facility-administered encounter medications on file as of 07/31/2021.   1. Dizziness Patient education given on supportive care.  Patient encouraged to check blood pressure at home on a daily basis and also during episodes of dizziness.  Patient to keep a written log and follow-up with this provider in 2 weeks.  Red flags given for prompt reevaluation.  Patient encouraged to increase hydration.  Patient to return to the beginning of next week for fasting labs to be completed  - CBC with Differential/Platelet; Future - Comp. Metabolic Panel (12); Future  2. Elevated blood pressure reading in office without diagnosis of hypertension   3. History of pyelonephritis UA negative for urinary tract infection.  Encourage patient to increase water intake - POCT URINALYSIS DIP (CLINITEK) - Urine Culture  4. Tinnitus of left ear Patient education given on supportive care, patient encouraged to discontinue use of Tylenol  5. Neuropathy Trial gabapentin, patient education given on supportive care - gabapentin (NEURONTIN) 100 MG capsule; Take 1 capsule (100 mg total) by mouth at bedtime.  Dispense: 30 capsule; Refill: 0  6. Insomnia, unspecified type Trial  hydroxyzine, patient education given on good sleep hygiene - hydrOXYzine (ATARAX/VISTARIL) 25 MG tablet; Take 1 tablet (25 mg total) by mouth at bedtime as needed.  Dispense: 30 tablet; Refill: 0  7. Anxiety about health  - hydrOXYzine (ATARAX/VISTARIL) 25 MG tablet; Take 1 tablet (25 mg total) by mouth at bedtime as needed.  Dispense: 30 tablet; Refill: 0  8. Screening, lipid  - Lipid panel; Future  9. Encounter for screening mammogram for malignant neoplasm of breast  - MM DIGITAL SCREENING BILATERAL; Future  10. Encounter for HCV screening test for low risk patient  - HCV Ab w Reflex to Quant PCR; Future   I have reviewed the patient's medical history (PMH, PSH, Social History, Family History, Medications, and allergies) , and have been updated if relevant. I spent 40 minutes reviewing chart and  face to face time with patient.    Follow-up: Return in about 2 weeks (around 08/14/2021).   Kasandra Knudsen Mayers, PA-C

## 2021-07-31 NOTE — Patient Instructions (Signed)
I do encourage you to discontinue using Tylenol to see if this helps improve your tinnitus.  I encourage you to increase your water intake.  I encourage you to check your blood pressure at home on a daily basis and also when you are having the episodes of dizziness.  Please keep a written log and bring that with you to your next office visit in 2 weeks.  You will use gabapentin 100 mg at bedtime to help you with your nerve pain in your foot.  You can use 1/2-1 full tablet of the hydroxyzine to help you with sleep if needed.  Roney Jaffe, PA-C Physician Assistant [facility] [email or web address]   Tinnitus Tinnitus refers to hearing a sound when there is no actual source for that sound. This is often described as ringing in the ears. However, people with this condition may hear a variety of noises, in one ear or in both ears. The sounds of tinnitus can be soft, loud, or somewhere in between. Tinnitus can last for a few seconds or can be constant for days. It may go away without treatment and come back at various times. When tinnitus is constant or happens often, it can lead to other problems, such as trouble sleeping and trouble concentrating. Almost everyone experiences tinnitus at some point. Tinnitus is not the same as hearing loss. Tinnitus that is long-lasting (chronic) or comes back often (recurs) may require medical attention. What are the causes? The cause of tinnitus is often not known. In some cases, it can result from: Exposure to loud noises from machinery, music, or other sources. An object (foreign body) stuck in the ear. Earwax buildup. Drinking alcohol or caffeine. Taking certain medicines. Age-related hearing loss. It may also be caused by medical conditions such as: Ear or sinus infections. Heart diseases or high blood pressure. Allergies. Mnire's disease. Thyroid problems. Tumors. A weak, bulging blood  vessel (aneurysm) near the ear. What increases the risk? The following factors may make you more likely to develop this condition: Exposure to loud noises. Age. Tinnitus is more likely in older individuals. Using alcohol or tobacco. What are the signs or symptoms? The main symptom of tinnitus is hearing a sound when there is no source for that sound. It may sound like: Buzzing. Sizzling. Ringing. Blowing air. Hissing. Whistling. Other sounds may include: Roaring. Running water. A musical note. Tapping. Humming. Symptoms may affect only one ear (unilateral) or both ears (bilateral). How is this diagnosed? Tinnitus is diagnosed based on your symptoms, your medical history, and a physical exam. Your health care provider may do a thorough hearing test (audiologic exam) if your tinnitus: Is unilateral. Causes hearing difficulties. Lasts 6 months or longer. You may work with a health care provider who specializes in hearing disorders (audiologist). You may be asked questions about your symptoms and how they affect your daily life. You may have other tests done, such as: CT scan. MRI. An imaging test of how blood flows through your blood vessels (angiogram). How is this treated? Treating an underlying medical condition can sometimes make tinnitus go away. If your tinnitus continues, other treatments may include: Therapy and counseling to help you manage the stress of living with tinnitus. Sound generators to mask the tinnitus. These include: Tabletop sound machines that play relaxing sounds to help you fall asleep. Wearable devices that fit in your ear and play sounds or music. Acoustic neural stimulation. This involves using headphones to listen to music that contains an auditory  signal. Over time, listening to this signal may change some pathways in your brain and make you less sensitive to tinnitus. This treatment is used for very severe cases when no other treatment is  working. Using hearing aids or cochlear implants if your tinnitus is related to hearing loss. Hearing aids are worn in the outer ear. Cochlear implants are surgically placed in the inner ear. Follow these instructions at home: Managing symptoms   When possible, avoid being in loud places and being exposed to loud sounds. Wear hearing protection, such as earplugs, when you are exposed to loud noises. Use a white noise machine, a humidifier, or other devices to mask the sound of tinnitus. Practice techniques for reducing stress, such as meditation, yoga, or deep breathing. Work with your health care provider if you need help with managing stress. Sleep with your head slightly raised. This may reduce the impact of tinnitus. General instructions Do not use stimulants, such as nicotine, alcohol, or caffeine. Talk with your health care provider about other stimulants to avoid. Stimulants are substances that can make you feel alert and attentive by increasing certain activities in the body (such as heart rate and blood pressure). These substances may make tinnitus worse. Take over-the-counter and prescription medicines only as told by your health care provider. Try to get plenty of sleep each night. Keep all follow-up visits. This is important. Contact a health care provider if: Your tinnitus continues for 3 weeks or longer without stopping. You develop sudden hearing loss. Your symptoms get worse or do not get better with home care. You feel you are not [place] to manage the stress of living with tinnitus. Get help right away if: You develop tinnitus after a head injury. You have tinnitus along with any of the following: Dizziness. Nausea and vomiting. Loss of balance. Sudden, severe headache. Vision changes. Facial weakness or weakness of arms or legs. These symptoms may represent a serious problem that is an emergency. Do not wait to see if the symptoms will go away. Get medical help right  away. Call your local emergency services (911 in the [place] Do not drive yourself to the hospital. Summary Tinnitus refers to hearing a sound when there is no actual source for that sound. This is often described as ringing in the ears. Symptoms may affect only one ear (unilateral) or both ears (bilateral). Use a white noise machine, a humidifier, or other devices to mask the sound of tinnitus. Do not use stimulants, such as nicotine, alcohol, or caffeine. These substances may make tinnitus worse. This information is not intended to replace advice given to you by your health care provider. Make sure you discuss any questions you have with your health care provider. Document Revised: 09/09/2020 Document Reviewed: 09/09/2020 Elsevier Patient Education  2022 Else[employer] Inc.  How to Take Your Blood Pressure Blood pressure is a measurement of how strongly your blood is pressing against the walls of your arteries. Arteries are blood vessels that carry blood from your heart throughout your body. Your health care provider takes your blood pressure at each office visit. You can also take your own blood pressure at home with a blood pressure monitor. You may need to take your own blood pressure to: Confirm a diagnosis of high blood pressure (hypertension). Monitor your blood pressure over time. Make sure your blood pressure medicine is working. Supplies needed: Blood pressure monitor. Dining room chair to sit in. Table or desk. Small notebook and pencil or pen. How to prepare  To get the most accurate reading, avoid the following for 30 minutes before you check your blood pressure: Drinking caffeine. Drinking alcohol. Eating. Smoking. Exercising. Five minutes before you check your blood pressure: Use the bathroom and urinate so that you have an empty bladder. Sit quietly in a dining room chair. Do not sit in a soft couch or an armchair. Do not talk. How to take your blood pressure To check your  blood pressure, follow the instructions in the manual that came with your blood pressure monitor. If you have a digital blood pressure monitor, the instructions may be as follows: Sit up straight in a chair. Place your feet on the floor. Do not cross your ankles or legs. Rest your left arm at the level of your heart on a table or desk or on the arm of a chair. Pull up your shirt sleeve. Wrap the blood pressure cuff around the upper part of your left arm, 1 inch (2.5 cm) above your elbow. It is best to wrap the cuff around bare skin. Fit the cuff snugly around your arm. You should be Demond to place only one finger between the cuff and your arm. Position the cord so that it rests in the bend of your elbow. Press the power button. Sit quietly while the cuff inflates and deflates. Read the digital reading on the monitor screen and write the numbers down (record them) in a notebook. Wait 2-3 minutes, then repeat the steps, starting at step 1. What does my blood pressure reading mean? A blood pressure reading consists of a higher number over a lower number. Ideally, your blood pressure should be below 120/80. The first ("top") number is called the systolic pressure. It is a measure of the pressure in your arteries as your heart beats. The second ("bottom") number is called the diastolic pressure. It is a measure of the pressure in your arteries as the heart relaxes. Blood pressure is classified into five stages. The following are the stages for adults who do not have a short-term serious illness or a chronic condition. Systolic pressure and diastolic pressure are measured in a unit called mm Hg (millimeters of mercury).  Normal Systolic pressure: below 120. Diastolic pressure: below 80. Elevated Systolic pressure: 120-129. Diastolic pressure: below 80. Hypertension stage 1 Systolic pressure: 130-139. Diastolic pressure: 80-89. Hypertension stage 2 Systolic pressure: 140 or above. Diastolic  pressure: 90 or above. You can have elevated blood pressure or hypertension even if only the systolic or only the diastolic number in your reading is higher than normal. Follow these instructions at home: Medicines Take over-the-counter and prescription medicines only as told by your health care provider. Tell your health care provider if you are having any side effects from blood pressure medicine. General instructions Check your blood pressure as often as recommended by your health care provider. Check your blood pressure at the same time every day. Take your monitor to the next appointment with your health care provider to make sure that: You are using it correctly. It provides accurate readings. Understand what your goal blood pressure numbers are. Keep all follow-up visits as told by your health care provider. This is important. General tips Your health care provider can suggest a reliable monitor that will meet your needs. There are several types of home blood pressure monitors. Choose a monitor that has an arm cuff. Do not choose a monitor that measures your blood pressure from your wrist or finger. Choose a cuff that wraps snugly around  your upper arm. You should be Skog to fit only one finger between your arm and the cuff. You can buy a blood pressure monitor at most drugstores or online. Where to find more information American Heart Association: www.heart.org Contact a health care provider if: Your blood pressure is consistently high. Your blood pressure is suddenly low. Get help right away if: Your systolic blood pressure is higher than 180. Your diastolic blood pressure is higher than 120. Summary Blood pressure is a measurement of how strongly your blood is pressing against the walls of your arteries. A blood pressure reading consists of a higher number over a lower number. Ideally, your blood pressure should be below 120/80. Check your blood pressure at the same time every  day. Avoid caffeine, alcohol, smoking, and exercise for 30 minutes prior to checking your blood pressure. These agents can affect the accuracy of the blood pressure reading. This information is not intended to replace advice given to you by your health care provider. Make sure you discuss any questions you have with your health care provider. Document Revised: 08/14/2020 Document Reviewed: 09/29/2019 Elsevier Patient Education  2022 [employer].

## 2021-07-31 NOTE — Progress Notes (Signed)
Patient has eaten today and patient has not taken medication today, Patient reports tinnitus beginning 1 month with constant concern, patient reports left ear. Patient denies any cleaning or drainage.  Patient reports numbing and burning sensation in the left foot since July and reports increased at night. Patient reports slight dizziness this morning. Hx of BP medication in her 30's. Patient reports being treated for UTI and misplacing a portion of the course. Requested retesting.

## 2021-08-04 LAB — URINE CULTURE

## 2021-08-04 MED ORDER — NITROFURANTOIN MONOHYD MACRO 100 MG PO CAPS: 100.0000 mg | ORAL_CAPSULE | Freq: Two times a day (BID) | ORAL | 0 refills | Status: AC

## 2021-08-04 NOTE — Addendum Note (Signed)
Addended by: Roney Jaffe on: 08/04/2021 01:49 PM   Modules accepted: Orders

## 2021-08-07 ENCOUNTER — Other Ambulatory Visit: Payer: Medicaid Other

## 2021-08-11 ENCOUNTER — Telehealth: Payer: Self-pay | Admitting: *Deleted

## 2021-08-11 NOTE — Telephone Encounter (Signed)
Patient verified DOB Patient is aware of needing to pick up UTI medication and will keep ENT appt on 10/26 and FU with [facility] in a week or two.

## 2021-08-11 NOTE — Telephone Encounter (Signed)
-----   Message from Roney Jaffe, [place] sent at 08/04/2021  1:49 PM EDT ----- Please call patient and let her know that she does still have a urinary tract infection.,  She will take Macrobid.  Prescription sent to her pharmacy.

## 2021-08-13 ENCOUNTER — Ambulatory Visit: Payer: Medicaid Other | Admitting: Physician Assistant

## 2021-08-18 ENCOUNTER — Ambulatory Visit: Payer: Self-pay | Admitting: *Deleted

## 2021-08-18 NOTE — Telephone Encounter (Signed)
Reason for Disposition  All other urine symptoms  Answer Assessment - Initial Assessment Questions 1. SYMPTOM: "What's the main symptom you're concerned about?" (e.g., frequency, incontinence)     Continued urinary symptoms and antibiotic unable to tolerate due to severe stomach upset  2. ONSET: "When did the  na  start?"     na 3. PAIN: "Is there any pain?" If Yes, ask: "How bad is it?" (Scale: 1-10; mild, moderate, severe)     severe 4. CAUSE: "What do you think is causing the symptoms?"     Antibiotics  5. OTHER SYMPTOMS: "Do you have any other symptoms?" (e.g., fever, flank pain, blood in urine, pain with urination)     Urine cloudy, vaginal itching  6. PREGNANCY: "Is there any chance you are pregnant?" "When was your last menstrual period?"     na  Protocols used: Urinary Symptoms-A-AH

## 2021-08-18 NOTE — Telephone Encounter (Signed)
C/o antibiotic , nitrofurantoin causing severe abdominal pain and patient reports she is unable to take any further. C/o urine cloudy, vaginal itching . Patient would like a different antibiotic to treat urinary tract infection. Patient reports she made appt to discuss with PCP regarding previous antibiotic. Patient reports "someone canceled appt" from 08/13/21 and she did not want appt canceled . Patient reports she is trying to get another antibiotic because she is still suffering with symptoms she was seen for on 07/31/21 but now also c/o vaginal itching. Please advise . Next appt scheduled for 08/29/21. Patient awaiting call back . Care advise given. Patient verbalized understanding of care advise and to call back if needed.

## 2021-08-18 NOTE — Telephone Encounter (Signed)
Addressed in triage call at 220 today 08/18/21

## 2021-08-19 ENCOUNTER — Ambulatory Visit: Payer: Medicaid Other | Admitting: Physician Assistant

## 2021-08-29 ENCOUNTER — Ambulatory Visit: Payer: Medicare Other | Attending: Internal Medicine | Admitting: Internal Medicine

## 2021-08-29 ENCOUNTER — Ambulatory Visit: Admission: RE | Admit: 2021-08-29 | Payer: Medicaid Other | Source: Ambulatory Visit

## 2021-08-29 ENCOUNTER — Other Ambulatory Visit: Payer: Self-pay

## 2021-08-29 ENCOUNTER — Encounter: Payer: Self-pay | Admitting: Internal Medicine

## 2021-08-29 VITALS — BP 149/90 | HR 97 | Resp 16 | Ht 66.0 in | Wt 173.6 lb

## 2021-08-29 DIAGNOSIS — N898 Other specified noninflammatory disorders of vagina: Secondary | ICD-10-CM

## 2021-08-29 DIAGNOSIS — Z1331 Encounter for screening for depression: Secondary | ICD-10-CM

## 2021-08-29 DIAGNOSIS — H903 Sensorineural hearing loss, bilateral: Secondary | ICD-10-CM

## 2021-08-29 DIAGNOSIS — N390 Urinary tract infection, site not specified: Secondary | ICD-10-CM | POA: Diagnosis not present

## 2021-08-29 DIAGNOSIS — Z2821 Immunization not carried out because of patient refusal: Secondary | ICD-10-CM

## 2021-08-29 DIAGNOSIS — E663 Overweight: Secondary | ICD-10-CM

## 2021-08-29 DIAGNOSIS — Z5320 Procedure and treatment not carried out because of patient's decision for unspecified reasons: Secondary | ICD-10-CM

## 2021-08-29 DIAGNOSIS — I10 Essential (primary) hypertension: Secondary | ICD-10-CM

## 2021-08-29 DIAGNOSIS — Z1211 Encounter for screening for malignant neoplasm of colon: Secondary | ICD-10-CM

## 2021-08-29 MED ORDER — FLUCONAZOLE 150 MG PO TABS: 150.0000 mg | ORAL_TABLET | Freq: Every day | ORAL | 0 refills | Status: DC

## 2021-08-29 MED ORDER — AMLODIPINE BESYLATE 5 MG PO TABS: 5.0000 mg | ORAL_TABLET | Freq: Every day | ORAL | 4 refills | Status: DC

## 2021-08-29 NOTE — Patient Instructions (Signed)

## 2021-08-29 NOTE — Progress Notes (Signed)
Patient ID: Erika Case, female    DOB: 11-Aug-1951  MRN: [id number]  CC: Establish Care   Subjective: Erika Case is a 70 y.o. female who presents for f/u visit and to est with me as PCP Her concerns today include:  Patient with history of elevated blood pressure, high-frequency sensory neuronal hearing loss, insomnia  Patient was seen by our PA on 07/31/2021 BP elevated on last visit No device to check BP.  Saw ENT 08/27/21 for tinnitis.  BP on that visit was 140/88 Thinks BP stays up because she has been tense and worried about the ringing in the ears.  ENT specialist told her that she has high-frequency sensorineural hearing loss which can cause tinnitus and she would benefit from hearing aids but patient declined wanting to try hearing aids at this time.  Treated with Macrobid for UTI one mth ago.  Pt reports she took the abx for 3 days then stopped because pt was upsetting her stomach.  Urine cx was positive for E.coli.  Urine is hot and she has been having some vaginal itching.  She would like to be rechecked for UTI.  Noted to be overweight for height.  Patient was surprised to learn this.  She states that she is active at work.  She works at [employer].  Positive depression and GAD-7 screening:  states she is not depressed.  Just upset about the ringing in the ears.  HM:  called for [facility] but she opted to call them back when she is ready to have it done.  Never had colon CA screen.  Declines flu and Prevnar 13.  Did not have COVID vaccines but plans to get it Patient Active Problem List   Diagnosis Date Noted   Dizziness 07/31/2021   Elevated blood pressure reading in office without diagnosis of hypertension 07/31/2021   Tinnitus of left ear 07/31/2021   Neuropathy 07/31/2021   Insomnia 07/31/2021   Anxiety about health 07/31/2021   Acute pyelonephritis 02/01/2021     Current Outpatient Medications on File Prior to Visit  Medication Sig Dispense Refill   acetaminophen  (TYLENOL) 325 MG tablet Take 325 mg by mouth at bedtime as needed (Sleep). Tylenol PM.     gabapentin (NEURONTIN) 100 MG capsule Take 1 capsule (100 mg total) by mouth at bedtime. 30 capsule 0   hydrOXYzine (ATARAX/VISTARIL) 25 MG tablet Take 1 tablet (25 mg total) by mouth at bedtime as needed. 30 tablet 0   No current facility-administered medications on file prior to visit.    No Known Allergies  Social History   Socioeconomic History   Marital status: Single    Spouse name: Not on file   Number of children: 2   Years of education: Not on file   Highest education level: Not on file  Occupational History   Occupation: [employer]  Tobacco Use   Smoking status: Never   Smokeless tobacco: Never  Vaping Use   Vaping Use: Never used  Substance and Sexual Activity   Alcohol use: No   Drug use: No   Sexual activity: Not Currently  Other Topics Concern   Not on file  Social History Narrative   Not on file   Social Determinants of Health   Financial Resource Strain: Not on file  Food Insecurity: Not on file  Transportation Needs: Not on file  Physical Activity: Not on file  Stress: Not on file  Social Connections: Not on file  Intimate Partner Violence: Not on file  No family history on file.  No past surgical history on file.  ROS: Review of Systems Negative except as stated above  PHYSICAL EXAM: BP (!) 149/90   Pulse 97   Resp 16   Ht 5\' 6"  (1.676 m)   Wt 173 lb 9.6 oz (78.7 kg)   SpO2 100%   BMI 28.02 kg/m   Wt Readings from Last 3 Encounters:  08/29/21 173 lb 9.6 oz (78.7 kg)  07/31/21 179 lb (81.2 kg)  02/01/21 180 lb 5.4 oz (81.8 kg)  Repeat blood pressure 143/90  Physical Exam  General appearance - alert, well appearing, older African-American female and in no distress.  She is noted to have to turn her head to 1 side to be [place] to hear me.  I had to speak in an amplified voice Mental status - normal mood, behavior, speech, dress, motor activity,  and thought processes Neck - supple, no significant adenopathy Chest - clear to auscultation, no wheezes, rales or rhonchi, symmetric air entry Heart - normal rate, regular rhythm, normal S1, S2, no murmurs, rubs, clicks or gallops Extremities - peripheral pulses normal, no pedal edema, no clubbing or cyanosis  Depression screen PHQ 2/9 08/29/2021  Down, Depressed, Hopeless 0  PHQ - 2 Score 0  Altered sleeping 3  Tired, decreased energy 3  Change in appetite 1  Feeling bad or failure about yourself  0  Trouble concentrating 0  Moving slowly or fidgety/restless 2  Suicidal thoughts 0  PHQ-9 Score 9   GAD 7 : Generalized Anxiety Score 08/29/2021  Nervous, Anxious, on Edge 1  Control/stop worrying 0  Worry too much - different things 0  Trouble relaxing 2  Restless 1  Easily annoyed or irritable 0  Afraid - awful might happen 1  Total GAD 7 Score 5     CMP Latest Ref Rng & Units 02/04/2021 02/03/2021 02/02/2021  Glucose 70 - 99 mg/dL 87 85 96  BUN 8 - 23 mg/dL 9 10 10   Creatinine 0.44 - 1.00 mg/dL 1.14(H) 1.11(H) 1.07(H)  Sodium 135 - 145 mmol/L 136 136 139  Potassium 3.5 - 5.1 mmol/L 4.2 3.4(L) 4.0  Chloride 98 - 111 mmol/L 103 104 109  CO2 22 - 32 mmol/L 26 23 21(L)  Calcium 8.9 - 10.3 mg/dL 9.4 9.1 9.2  Total Protein 6.5 - 8.1 g/dL - - 7.0  Total Bilirubin 0.3 - 1.2 mg/dL - - 0.6  Alkaline Phos 38 - 126 U/L - - 75  AST 15 - 41 U/L - - 26  ALT 0 - 44 U/L - - 17   Lipid Panel  No results found for: CHOL, TRIG, HDL, CHOLHDL, VLDL, LDLCALC, LDLDIRECT  CBC    Component Value Date/Time   WBC 7.1 02/04/2021 0502   RBC 4.20 02/04/2021 0502   HGB 11.6 (L) 02/04/2021 0502   HCT 36.8 02/04/2021 0502   PLT 218 02/04/2021 0502   MCV 87.6 02/04/2021 0502   MCH 27.6 02/04/2021 0502   MCHC 31.5 02/04/2021 0502   RDW 13.2 02/04/2021 0502   LYMPHSABS 1.7 02/03/2021 0715   MONOABS 0.8 02/03/2021 0715   EOSABS 0.1 02/03/2021 0715   BASOSABS 0.1 02/03/2021 0715    ASSESSMENT  AND PLAN: 1. Essential hypertension Blood pressure has been consistently high.  I recommend starting amlodipine to help lower the blood pressure.  DASH diet discussed and encouraged.  Follow-up with clinical pharmacist in 1 month for repeat blood pressure check.  Patient declines having baseline blood  test done stating that she is afraid of needles. - amLODipine (NORVASC) 5 MG tablet; Take 1 tablet (5 mg total) by mouth daily.  Dispense: 30 tablet; Refill: 4  2. Sensorineural hearing loss (SNHL) of both ears Advised patient that she may want to consider getting a hearing aid as recommended by the ENT to see whether the ringing in the ears and decrease  3. Vaginal itching Likely vaginal yeast from previous antibiotic use.  Treat empirically with Diflucan. - fluconazole (DIFLUCAN) 150 MG tablet; Take 1 tablet (150 mg total) by mouth daily.  Dispense: 1 tablet; Refill: 0  4. Urinary tract infection without hematuria, site unspecified - Urinalysis, Routine w reflex microscopic  5. Overweight (BMI 25.0-29.9) Discussed and encourage healthy eating habits.  Advised to eliminate sugary drinks from the diet, cut back on portion sizes of white carbohydrates, incorporate fresh fruits and vegetables into the diet and eat more white lean meat instead of red meat.  Encouraged her to walk several times a week for exercise.  6. Screening for colon cancer - Cologuard  7. Pneumococcal vaccination declined   8. Influenza vaccination declined   9. Blood test declined   10. Positive depression screening Patient reports this is not a major issue for her.    Patient was given the opportunity to ask questions.  Patient verbalized understanding of the plan and was [place] to repeat key elements of the plan.   Orders Placed This Encounter  Procedures   Cologuard   Urinalysis, Routine w reflex microscopic     Requested Prescriptions   Signed Prescriptions Disp Refills   amLODipine (NORVASC) 5 MG  tablet 30 tablet 4    Sig: Take 1 tablet (5 mg total) by mouth daily.   fluconazole (DIFLUCAN) 150 MG tablet 1 tablet 0    Sig: Take 1 tablet (150 mg total) by mouth daily.    Return in about 4 months (around 12/27/2021) for Give appt with Franky Macho in 1 mth for BP recheck.  Jonah Blue, MD, FACP

## 2021-08-30 LAB — URINALYSIS, ROUTINE W REFLEX MICROSCOPIC
Bilirubin, UA: NEGATIVE
Glucose, UA: NEGATIVE
Ketones, UA: NEGATIVE
Nitrite, UA: NEGATIVE
Protein,UA: NEGATIVE
RBC, UA: NEGATIVE
Specific Gravity, UA: 1.014 (ref 1.005–1.030)
Urobilinogen, Ur: 0.2 mg/dL (ref 0.2–1.0)
pH, UA: 6.5 (ref 5.0–7.5)

## 2021-08-30 LAB — MICROSCOPIC EXAMINATION
Casts: NONE SEEN /lpf
RBC, Urine: NONE SEEN /hpf (ref 0–2)

## 2021-09-01 ENCOUNTER — Telehealth: Payer: Self-pay

## 2021-09-01 NOTE — Telephone Encounter (Signed)
Contacted pt to go over lab results pt is aware and doesn't have any questions or concerns 

## 2021-10-17 ENCOUNTER — Emergency Department (HOSPITAL_COMMUNITY)
Admission: EM | Admit: 2021-10-17 | Discharge: 2021-10-18 | Disposition: A | Discharge status: Home / Self Care | Payer: Medicare Other | Attending: Emergency Medicine | Admitting: Emergency Medicine

## 2021-10-17 ENCOUNTER — Emergency Department (HOSPITAL_COMMUNITY): Payer: Medicare Other

## 2021-10-17 ENCOUNTER — Other Ambulatory Visit: Payer: Self-pay

## 2021-10-17 DIAGNOSIS — I10 Essential (primary) hypertension: Secondary | ICD-10-CM | POA: Insufficient documentation

## 2021-10-17 DIAGNOSIS — Z79899 Other long term (current) drug therapy: Secondary | ICD-10-CM | POA: Diagnosis not present

## 2021-10-17 DIAGNOSIS — J029 Acute pharyngitis, unspecified: Secondary | ICD-10-CM | POA: Diagnosis present

## 2021-10-17 DIAGNOSIS — R519 Headache, unspecified: Secondary | ICD-10-CM | POA: Insufficient documentation

## 2021-10-17 DIAGNOSIS — U071 COVID-19: Secondary | ICD-10-CM | POA: Insufficient documentation

## 2021-10-17 LAB — BASIC METABOLIC PANEL
Anion gap: 10 (ref 5–15)
BUN: 17 mg/dL (ref 8–23)
CO2: 21 mmol/L — ABNORMAL LOW (ref 22–32)
Calcium: 8.9 mg/dL (ref 8.9–10.3)
Chloride: 104 mmol/L (ref 98–111)
Creatinine, Ser: 1.17 mg/dL — ABNORMAL HIGH (ref 0.44–1.00)
GFR, Estimated: 50 mL/min — ABNORMAL LOW (ref >60)
Glucose, Bld: 101 mg/dL — ABNORMAL HIGH (ref 70–99)
Potassium: 3.4 mmol/L — ABNORMAL LOW (ref 3.5–5.1)
Sodium: 135 mmol/L (ref 135–145)

## 2021-10-17 LAB — CBC WITH DIFFERENTIAL/PLATELET
Abs Immature Granulocytes: 0 10*3/uL (ref 0.00–0.07)
Basophils Absolute: 0 10*3/uL (ref 0.0–0.1)
Basophils Relative: 1 %
Eosinophils Absolute: 0 10*3/uL (ref 0.0–0.5)
Eosinophils Relative: 0 %
HCT: 38.8 % (ref 36.0–46.0)
Hemoglobin: 12.8 g/dL (ref 12.0–15.0)
Immature Granulocytes: 0 %
Lymphocytes Relative: 49 %
Lymphs Abs: 1.4 10*3/uL (ref 0.7–4.0)
MCH: 27.5 pg (ref 26.0–34.0)
MCHC: 33 g/dL (ref 30.0–36.0)
MCV: 83.4 fL (ref 80.0–100.0)
Monocytes Absolute: 0.2 10*3/uL (ref 0.1–1.0)
Monocytes Relative: 8 %
Neutro Abs: 1.2 10*3/uL — ABNORMAL LOW (ref 1.7–7.7)
Neutrophils Relative %: 42 %
Platelets: 185 10*3/uL (ref 150–400)
RBC: 4.65 MIL/uL (ref 3.87–5.11)
RDW: 12.8 % (ref 11.5–15.5)
WBC: 2.9 10*3/uL — ABNORMAL LOW (ref 4.0–10.5)
nRBC: 0 % (ref 0.0–0.2)

## 2021-10-17 MED ORDER — SODIUM CHLORIDE 0.9 % IV BOLUS
500.0000 mL | Freq: Once | INTRAVENOUS | Status: AC
  Administered 2021-10-17: 23:00:00 500 mL via INTRAVENOUS

## 2021-10-17 MED ORDER — DIPHENHYDRAMINE HCL 50 MG/ML IJ SOLN
12.5000 mg | Freq: Once | INTRAMUSCULAR | Status: AC
  Administered 2021-10-17: 22:00:00 12.5 mg via INTRAVENOUS
  Filled 2021-10-17: qty 1

## 2021-10-17 MED ORDER — MORPHINE SULFATE (PF) 4 MG/ML IV SOLN
4.0000 mg | Freq: Once | INTRAVENOUS | Status: AC
  Administered 2021-10-17: 22:00:00 4 mg via INTRAVENOUS
  Filled 2021-10-17: qty 1

## 2021-10-17 MED ORDER — ONDANSETRON HCL 4 MG/2ML IJ SOLN
4.0000 mg | Freq: Once | INTRAMUSCULAR | Status: AC
  Administered 2021-10-17: 22:00:00 4 mg via INTRAVENOUS
  Filled 2021-10-17: qty 2

## 2021-10-17 NOTE — ED Provider Notes (Signed)
Emergency Medicine Provider Triage Evaluation Note  Erika Case , a 70 y.o. female  was evaluated in triage.  Pt complains of headache and dizziness.  Patient states that for 2 days she has had constant dizziness and headache that she describes as severe.  She states that she has had headaches previously but not as severe as this.  She describes the room spinning.  She has had dry heaving and inability to tolerate p.o.  Additionally for the past week she states that she has been feeling weak and achy.  She denies abdominal pain or diarrhea.  She denies any visual changes or fevers.  Review of Systems  Positive: See above Negative:   Physical Exam  BP (!) 143/102    Pulse 86    Temp 97.7 F (36.5 C) (Oral)    Resp 20    Ht 5\' 6"  (1.676 m)    Wt 72.6 kg    SpO2 99%    BMI 25.82 kg/m  Gen:   Awake, alert distress Resp:  Normal effort  MSK:   Moves extremities without difficulty  Other:  No focal neurological deficits.  No nystagmus.  No pronator drift.  Cranial nerves intact.  Sensation intact.  Motor intact.  Oropharynx with erythema.  Medical Decision Making  Medically screening exam initiated at 9:36 PM.  Appropriate orders placed.  Riyan Kolle was informed that the remainder of the evaluation will be completed by another provider, this initial triage assessment does not replace that evaluation, and the importance of remaining in the ED until their evaluation is complete.  We will start with labs and headache cocktail, obtaining CT head as symptoms are new from previous   10/17/21 2138    2139, MD 10/29/21 2006

## 2021-10-17 NOTE — ED Triage Notes (Signed)
Patient reports she has been sick all week long feeling weak and achy. Says these symptoms stopped bout now her head is spinning and she feels dizzy for past two days. Headache pain rated 7/10

## 2021-10-18 LAB — RESP PANEL BY RT-PCR (FLU A&B, COVID) ARPGX2
Influenza A by PCR: NEGATIVE
Influenza B by PCR: NEGATIVE
SARS Coronavirus 2 by RT PCR: POSITIVE — AB

## 2021-10-18 NOTE — ED Provider Notes (Signed)
[facility]-EMERGENCY DEPT Provider Note   CSN: [id number] Arrival date & time: 10/17/21  2043     History No chief complaint on file.   Erika Case is a 70 y.o. female.  Patient presents ER chief complaint of generalized malaise, sore throat.  She states the headache started 2 days ago.  Initially started a gradual low level but continue to bother her yesterday and today feels worse.  Denies neck pain or back pain denies chest pain denies abdominal pain denies vomiting or diarrhea.      Past Medical History:  Diagnosis Date   Hypertension     Patient Active Problem List   Diagnosis Date Noted   Dizziness 07/31/2021   Elevated blood pressure reading in office without diagnosis of hypertension 07/31/2021   Tinnitus of left ear 07/31/2021   Neuropathy 07/31/2021   Insomnia 07/31/2021   Anxiety about health 07/31/2021   Acute pyelonephritis 02/01/2021    No past surgical history on file.   OB History   No obstetric history on file.     No family history on file.  Social History   Tobacco Use   Smoking status: Never   Smokeless tobacco: Never  Vaping Use   Vaping Use: Never used  Substance Use Topics   Alcohol use: No   Drug use: No    Home Medications Prior to Admission medications   Medication Sig Start Date End Date Taking? Authorizing Provider  acetaminophen (TYLENOL) 325 MG tablet Take 325 mg by mouth at bedtime as needed (Sleep). Tylenol PM.    [provider]  amLODipine (NORVASC) 5 MG tablet Take 1 tablet (5 mg total) by mouth daily. 08/29/21   Marcine Matar, MD  fluconazole (DIFLUCAN) 150 MG tablet Take 1 tablet (150 mg total) by mouth daily. 08/29/21   Marcine Matar, MD  gabapentin (NEURONTIN) 100 MG capsule Take 1 capsule (100 mg total) by mouth at bedtime. 07/31/21   Mayers, Cari S, PA-C  hydrOXYzine (ATARAX/VISTARIL) 25 MG tablet Take 1 tablet (25 mg total) by mouth at bedtime as needed. 07/31/21   Mayers,  Cari S, PA-C    Allergies    Patient has no known allergies.  Review of Systems   Review of Systems  Constitutional:  Negative for fever.  HENT:  Negative for ear pain.   Eyes:  Negative for pain.  Respiratory:  Negative for cough.   Cardiovascular:  Negative for chest pain.  Gastrointestinal:  Negative for abdominal pain.  Genitourinary:  Negative for flank pain.  Musculoskeletal:  Negative for back pain.  Skin:  Negative for rash.  Neurological:  Positive for headaches.   Physical Exam Updated Vital Signs BP (!) 161/107    Pulse 65    Temp 97.7 F (36.5 C) (Oral)    Resp 17    Ht 5\' 6"  (1.676 m)    Wt 72.6 kg    SpO2 100%    BMI 25.82 kg/m   Physical Exam Constitutional:      General: She is not in acute distress.    Appearance: Normal appearance.  HENT:     Head: Normocephalic.     Nose: Nose normal.     Mouth/Throat:     Pharynx: Oropharynx is clear. No oropharyngeal exudate or posterior oropharyngeal erythema.     Comments: Uvula midline Eyes:     Extraocular Movements: Extraocular movements intact.  Cardiovascular:     Rate and Rhythm: Normal rate.  Pulmonary:  Effort: Pulmonary effort is normal.  Musculoskeletal:        General: Normal range of motion.     Cervical back: Normal range of motion.  Neurological:     General: No focal deficit present.     Mental Status: She is alert. Mental status is at baseline.    ED Results / Procedures / Treatments   Labs (all labs ordered are listed, but only abnormal results are displayed) Labs Reviewed  CBC WITH DIFFERENTIAL/PLATELET - Abnormal; Notable for the following components:      Result Value   WBC 2.9 (*)    Neutro Abs 1.2 (*)    All other components within normal limits  BASIC METABOLIC PANEL - Abnormal; Notable for the following components:   Potassium 3.4 (*)    CO2 21 (*)    Glucose, Bld 101 (*)    Creatinine, Ser 1.17 (*)    GFR, Estimated 50 (*)    All other components within normal limits   RESP PANEL BY RT-PCR (FLU A&B, COVID) ARPGX2  RESP PANEL BY RT-PCR (FLU A&B, COVID) ARPGX2    EKG None  Radiology CT Head Wo Contrast  Result Date: 10/17/2021 CLINICAL DATA:  Headache. EXAM: CT HEAD WITHOUT CONTRAST TECHNIQUE: Contiguous axial images were obtained from the base of the skull through the vertex without intravenous contrast. COMPARISON:  10/15/2011 FINDINGS: Brain: No evidence of acute infarction, hemorrhage, hydrocephalus, extra-axial collection or mass lesion/mass effect. Vascular: No hyperdense vessel or unexpected calcification. Skull: Normal. Negative for fracture or focal lesion. Sinuses/Orbits: Visualized globes and orbits are unremarkable. Visualized sinuses are clear. Other: None. IMPRESSION: Normal unenhanced CT scan of the brain. Electronically Signed   By: Amie Portland M.D.   On: 10/17/2021 21:55    Procedures Procedures   Medications Ordered in ED Medications  morphine 4 MG/ML injection 4 mg (4 mg Intravenous Given 10/17/21 2224)  ondansetron (ZOFRAN) injection 4 mg (4 mg Intravenous Given 10/17/21 2221)  diphenhydrAMINE (BENADRYL) injection 12.5 mg (12.5 mg Intravenous Given 10/17/21 2223)  sodium chloride 0.9 % bolus 500 mL (500 mLs Intravenous New Bag/Given 10/17/21 2253)    ED Course  I have reviewed the triage vital signs and the nursing notes.  Pertinent labs & imaging results that were available during my care of the patient were reviewed by me and considered in my medical decision making (see chart for details).    MDM Rules/Calculators/A&P                         Labs unremarkable.  CT head shows no acute findings.  Patient given medications in triage and now feeling much better.  COVID test sent and pending final result.  Advised outpatient follow-up and isolation until results have returned.  Advised immediate return for worsening symptoms fevers or any additional concerns.  Differential did include meningitis and subarachnoid hemorrhage,  however history is more gradual onset headache and no thunderclap or severe headache described.  No meningismal signs noted otherwise.  Work-up otherwise unremarkable patient discharged home in stable condition.  Advised her to call her primary care doctor within the week for close follow-up.     Final Clinical Impression(s) / ED Diagnoses Final diagnoses:  Nonintractable headache, unspecified chronicity pattern, unspecified headache type    Rx / DC Orders ED Discharge Orders     None        Cheryll Cockayne, MD 10/18/21 [phone]

## 2021-10-18 NOTE — Discharge Instructions (Signed)
Call your primary care doctor or specialist as discussed in the next 2-3 days.   Return immediately back to the ER if:  Your symptoms worsen within the next 12-24 hours. You develop new symptoms such as new fevers, persistent vomiting, new pain, shortness of breath, or new weakness or numbness, or if you have any other concerns.  

## 2021-10-18 NOTE — ED Notes (Signed)
Pt A&Ox4 ambulatory at d/c with independent steady gait, NAD. Pt verbalized understanding of d/c instructions and follow up care. °

## 2021-10-22 ENCOUNTER — Encounter (HOSPITAL_COMMUNITY): Payer: Self-pay | Admitting: Emergency Medicine

## 2021-10-22 ENCOUNTER — Other Ambulatory Visit: Payer: Self-pay

## 2021-10-22 ENCOUNTER — Emergency Department (HOSPITAL_COMMUNITY): Payer: Medicare Other

## 2021-10-22 ENCOUNTER — Emergency Department (HOSPITAL_COMMUNITY)
Admission: EM | Admit: 2021-10-22 | Discharge: 2021-10-23 | Disposition: A | Discharge status: Home / Self Care | Payer: Medicare Other | Attending: Emergency Medicine | Admitting: Emergency Medicine

## 2021-10-22 DIAGNOSIS — U071 COVID-19: Secondary | ICD-10-CM | POA: Insufficient documentation

## 2021-10-22 DIAGNOSIS — R519 Headache, unspecified: Secondary | ICD-10-CM

## 2021-10-22 DIAGNOSIS — I10 Essential (primary) hypertension: Secondary | ICD-10-CM | POA: Diagnosis not present

## 2021-10-22 DIAGNOSIS — K449 Diaphragmatic hernia without obstruction or gangrene: Secondary | ICD-10-CM

## 2021-10-22 DIAGNOSIS — Z79899 Other long term (current) drug therapy: Secondary | ICD-10-CM | POA: Insufficient documentation

## 2021-10-22 DIAGNOSIS — Z28310 Unvaccinated for covid-19: Secondary | ICD-10-CM | POA: Insufficient documentation

## 2021-10-22 LAB — URINALYSIS, ROUTINE W REFLEX MICROSCOPIC
Bacteria, UA: NONE SEEN
Bilirubin Urine: NEGATIVE
Glucose, UA: NEGATIVE mg/dL
Hgb urine dipstick: NEGATIVE
Ketones, ur: 20 mg/dL — AB
Nitrite: NEGATIVE
Protein, ur: 30 mg/dL — AB
Specific Gravity, Urine: 1.021 (ref 1.005–1.030)
pH: 6 (ref 5.0–8.0)

## 2021-10-22 LAB — LACTIC ACID, PLASMA: Lactic Acid, Venous: 1.8 mmol/L (ref 0.5–1.9)

## 2021-10-22 LAB — BASIC METABOLIC PANEL
Anion gap: 10 (ref 5–15)
BUN: 14 mg/dL (ref 8–23)
CO2: 20 mmol/L — ABNORMAL LOW (ref 22–32)
Calcium: 9.4 mg/dL (ref 8.9–10.3)
Chloride: 108 mmol/L (ref 98–111)
Creatinine, Ser: 1.07 mg/dL — ABNORMAL HIGH (ref 0.44–1.00)
GFR, Estimated: 56 mL/min — ABNORMAL LOW (ref >60)
Glucose, Bld: 87 mg/dL (ref 70–99)
Potassium: 3.5 mmol/L (ref 3.5–5.1)
Sodium: 138 mmol/L (ref 135–145)

## 2021-10-22 LAB — CBC
HCT: 41.9 % (ref 36.0–46.0)
Hemoglobin: 14 g/dL (ref 12.0–15.0)
MCH: 27.9 pg (ref 26.0–34.0)
MCHC: 33.4 g/dL (ref 30.0–36.0)
MCV: 83.6 fL (ref 80.0–100.0)
Platelets: 208 10*3/uL (ref 150–400)
RBC: 5.01 MIL/uL (ref 3.87–5.11)
RDW: 12.7 % (ref 11.5–15.5)
WBC: 5.1 10*3/uL (ref 4.0–10.5)
nRBC: 0 % (ref 0.0–0.2)

## 2021-10-22 LAB — RESP PANEL BY RT-PCR (FLU A&B, COVID) ARPGX2
Influenza A by PCR: NEGATIVE
Influenza B by PCR: NEGATIVE
SARS Coronavirus 2 by RT PCR: POSITIVE — AB

## 2021-10-22 LAB — CBG MONITORING, ED: Glucose-Capillary: 78 mg/dL (ref 70–99)

## 2021-10-22 MED ORDER — MECLIZINE HCL 25 MG PO TABS
25.0000 mg | ORAL_TABLET | Freq: Once | ORAL | Status: AC
  Administered 2021-10-22: 25 mg via ORAL
  Filled 2021-10-22: qty 1

## 2021-10-22 MED ORDER — MECLIZINE HCL 25 MG PO TABS
25.0000 mg | ORAL_TABLET | Freq: Once | ORAL | Status: AC
Start: 2021-10-22 — End: 2021-10-22
  Administered 2021-10-22: 25 mg via ORAL
  Filled 2021-10-22: qty 1

## 2021-10-22 MED ORDER — ACETAMINOPHEN 325 MG PO TABS
650.0000 mg | ORAL_TABLET | Freq: Once | ORAL | Status: AC
  Administered 2021-10-22: 650 mg via ORAL
  Filled 2021-10-22: qty 2

## 2021-10-22 MED ORDER — ONDANSETRON 4 MG PO TBDP
4.0000 mg | ORAL_TABLET | Freq: Once | ORAL | Status: AC
  Administered 2021-10-22: 4 mg via ORAL
  Filled 2021-10-22: qty 1

## 2021-10-22 MED ORDER — LACTATED RINGERS IV BOLUS
500.0000 mL | Freq: Once | INTRAVENOUS | Status: AC
  Administered 2021-10-22: 500 mL via INTRAVENOUS

## 2021-10-22 MED ORDER — ONDANSETRON HCL 4 MG/2ML IJ SOLN
4.0000 mg | Freq: Once | INTRAMUSCULAR | Status: AC
  Administered 2021-10-22: 4 mg via INTRAVENOUS
  Filled 2021-10-22: qty 2

## 2021-10-22 NOTE — ED Provider Notes (Signed)
[facility] DEPT Provider Note   CSN: [id number] Arrival date & time: 10/22/21  1120     History  Chief Complaint  Patient presents with   Headache    Erika Case is a 71 y.o. female with past medical history of hypertension, who presents today for evaluation of headache, body aches, nausea, fatigue, feeling lightheaded/dizzy, poor p.o. intake and generally feeling unwell. She was seen 10/17/2021 for similar, at that point she reported her symptoms had been ongoing for 2 days and she tested positive for COVID, however she tells me she has been sick for two weeks.  She has not been vaccinated against COVID, and has not had COVID before.  She states that she has not had any interventions at home.  Her primary concern today is her headache.  She denies specific abdominal pain.  No rashes.  She denies cough or shortness of breath. No chest pain.   HPI     Home Medications Prior to Admission medications   Medication Sig Start Date End Date Taking? Authorizing Provider  ondansetron (ZOFRAN-ODT) 4 MG disintegrating tablet Take 1 tablet (4 mg total) by mouth every 8 (eight) hours as needed for nausea or vomiting. 10/23/21  Yes Lorin Glass, PA-C  acetaminophen (TYLENOL) 325 MG tablet Take 325 mg by mouth at bedtime as needed (Sleep). Tylenol PM.    [provider]  amLODipine (NORVASC) 5 MG tablet Take 1 tablet (5 mg total) by mouth daily. 08/29/21   Ladell Pier, MD  fluconazole (DIFLUCAN) 150 MG tablet Take 1 tablet (150 mg total) by mouth daily. 08/29/21   Ladell Pier, MD  gabapentin (NEURONTIN) 100 MG capsule Take 1 capsule (100 mg total) by mouth at bedtime. 07/31/21   Mayers, Cari S, PA-C  hydrOXYzine (ATARAX/VISTARIL) 25 MG tablet Take 1 tablet (25 mg total) by mouth at bedtime as needed. 07/31/21   Mayers, Cari S, PA-C      Allergies    Patient has no known allergies.    Review of Systems   Review of Systems   Constitutional:  Positive for chills and fatigue.  HENT:  Positive for congestion.   Eyes:  Negative for visual disturbance.  Respiratory:  Positive for cough (Occasional).   Cardiovascular:  Negative for chest pain and leg swelling.  Gastrointestinal:  Positive for nausea. Negative for abdominal pain, diarrhea and vomiting.  Genitourinary:  Negative for dysuria.  Musculoskeletal:  Positive for arthralgias and myalgias.  Skin:  Negative for color change and rash.  Neurological:  Positive for weakness (Global, non focal) and headaches.  Psychiatric/Behavioral:  Negative for confusion.   All other systems reviewed and are negative.  Physical Exam Updated Vital Signs BP 137/81 (BP Location: Right Arm)    Pulse 70    Temp 98 F (36.7 C) (Oral)    Resp 10    Ht 5\' 6"  (1.676 m)    Wt 72.6 kg    SpO2 100%    BMI 25.82 kg/m  Physical Exam Vitals and nursing note reviewed.  Constitutional:      General: She is not in acute distress.    Appearance: She is well-developed. She is not ill-appearing.  HENT:     Head: Normocephalic and atraumatic.  Eyes:     Conjunctiva/sclera: Conjunctivae normal.  Cardiovascular:     Rate and Rhythm: Normal rate and regular rhythm.  Pulmonary:     Effort: Pulmonary effort is normal. No respiratory distress.  Abdominal:  General: There is no distension.  Musculoskeletal:     Cervical back: Normal range of motion and neck supple. No tenderness.     Comments: No obvious acute injury  Skin:    General: Skin is warm and dry.  Neurological:     Mental Status: She is alert and oriented to person, place, and time.     Comments: Awake and alert, answers all questions appropriately.  Speech is not slurred.  Psychiatric:        Mood and Affect: Mood normal.        Behavior: Behavior normal.    ED Results / Procedures / Treatments   Labs (all labs ordered are listed, but only abnormal results are displayed) Labs Reviewed  RESP PANEL BY RT-PCR (FLU A&B,  COVID) ARPGX2 - Abnormal; Notable for the following components:      Result Value   SARS Coronavirus 2 by RT PCR POSITIVE (*)    All other components within normal limits  BASIC METABOLIC PANEL - Abnormal; Notable for the following components:   CO2 20 (*)    Creatinine, Ser 1.07 (*)    GFR, Estimated 56 (*)    All other components within normal limits  URINALYSIS, ROUTINE W REFLEX MICROSCOPIC - Abnormal; Notable for the following components:   APPearance HAZY (*)    Ketones, ur 20 (*)    Protein, ur 30 (*)    Leukocytes,Ua TRACE (*)    All other components within normal limits  LACTIC ACID, PLASMA - Abnormal; Notable for the following components:   Lactic Acid, Venous 2.6 (*)    All other components within normal limits  CBC  LACTIC ACID, PLASMA  HEPATIC FUNCTION PANEL  LACTIC ACID, PLASMA  CBG MONITORING, ED    EKG None  Radiology CT HEAD WO CONTRAST  Result Date: 10/22/2021 CLINICAL DATA:  A 71 year old female presents for headache. EXAM: CT HEAD WITHOUT CONTRAST TECHNIQUE: Contiguous axial images were obtained from the base of the skull through the vertex without intravenous contrast. COMPARISON:  October 17, 2021. FINDINGS: Brain: No evidence of acute infarction, hemorrhage, hydrocephalus, extra-axial collection or mass lesion/mass effect. Vascular: No hyperdense vessel or unexpected calcification. Skull: Normal. Negative for fracture or focal lesion. Sinuses/Orbits: Visualized paranasal sinuses and orbits are unremarkable. Other: None IMPRESSION: No acute intracranial abnormality. Electronically Signed   By: Zetta Bills M.D.   On: 10/22/2021 12:32    Procedures Procedures    Medications Ordered in ED Medications  ondansetron (ZOFRAN-ODT) disintegrating tablet 4 mg (4 mg Oral Given 10/22/21 1241)  meclizine (ANTIVERT) tablet 25 mg (25 mg Oral Given 10/22/21 1241)  meclizine (ANTIVERT) tablet 25 mg (25 mg Oral Given 10/22/21 2302)  lactated ringers bolus 500 mL (0 mLs  Intravenous Stopped 10/23/21 0124)  acetaminophen (TYLENOL) tablet 650 mg (650 mg Oral Given 10/22/21 2302)  ondansetron (ZOFRAN) injection 4 mg (4 mg Intravenous Given 10/22/21 2302)    ED Course/ Medical Decision Making/ A&P Clinical Course as of 10/23/21 0249  Thu Oct 23, 2021  0246 Patient reevaluated.  She is not orthostatic according to her nurse and was [place] to ambulate without desaturation.  She states she feels slightly better than when she came in. [EH]    Clinical Course User Index [EH] Lorin Glass, PA-C                           Medical Decision Making  Erika Case was evaluated in Emergency Department  on 10/23/2021 for the symptoms described in the history of present illness. She was evaluated in the context of the global COVID-19 pandemic, which necessitated consideration that the patient might be at risk for infection with the SARS-CoV-2 virus that causes COVID-19. Institutional protocols and algorithms that pertain to the evaluation of patients at risk for COVID-19 are in a state of rapid change based on information released by regulatory bodies including the CDC and federal and state organizations. These policies and algorithms were followed during the patient's care in the ED.  This patient presents to the ED for concern of headache this involves an extensive number of treatment options, and is a complaint that carries with it a high risk of complications and morbidity.  The differential diagnosis includes COVID-19 induced headache, stroke, intracranial hemorrhage, migraine, increased intracranial pressure, meningitis, dehydration, tension headache, electrolyte derangements   Co morbidities that complicate the patient evaluation  Hypertension   Additional history obtained:  Additional history obtained from chart review External records from outside source obtained and reviewed including most recent visit from [facility]   Lab Tests:  I Ordered, and personally  interpreted labs.  The pertinent results include: Respiratory panel positive for COVID.  Hemoglobin 14 without anemia, no leukocytosis.  BMP without acute electrolyte derangements.  UA does show 20 ketones, specific gravity is 1.021 indicating that patient is mildly dehydrated.  Hepatic function panel is normal.  Initial lactic acid was nonelevated.  Repeat was obtained however was hemolyzed according to RN, on repeat it was normal without additional fluids given so I suspect the elevated lactic acid was an error.   Imaging Studies ordered:  I ordered imaging studies including CT head, cxr I independently visualized and interpreted imaging which showed no acute abnormalities, unchanged from previous CT head I agree with the radiologist interpretation   Medicines ordered and prescription drug management:  I ordered medication including IV fluids, meclizine, acetaminophen, Zofran for headache, dizziness, nausea Reevaluation of the patient after these medicines showed that the patient improved   Problem List / ED Course:  Headache, COVID-19 infection   Reevaluation:  After the interventions noted above, I reevaluated the patient and found that they have :improved   Dispostion:  After consideration of the diagnostic results and the patients response to treatment, patient does not meet criteria for admission.  She is not orthostatic and was Cas[place] to ambulate without desaturations.  She is outside the window for initiation of oral antivirals.   Return precautions were discussed with patient who states their understanding.  At the time of discharge patient denied any unaddressed complaints or concerns.  Patient is agreeable for discharge home.  Note: Portions of this report may have been transcribed using voice recognition software. Every effort was made to ensure accuracy; however, inadvertent computerized transcription errors may be present    Final Clinical Impression(s) / ED  Diagnoses Final diagnoses:  COVID-19  Acute nonintractable headache, unspecified headache type    Rx / DC Orders ED Discharge Orders          Ordered    ondansetron (ZOFRAN-ODT) 4 MG disintegrating tablet  Every 8 hours PRN        10/23/21 0249              Lorin Glass, PA-C 10/23/21 [id number]    Quintella Reichert, MD 10/23/21 [phone]

## 2021-10-22 NOTE — ED Notes (Signed)
I attempted to collect labs and was unsuccessful. 

## 2021-10-22 NOTE — ED Notes (Signed)
Pt ambulated to room unassisted with a steady gait.

## 2021-10-22 NOTE — ED Triage Notes (Signed)
Patient reports being dx w/ Covid 5 days ago, states she got medicine in the ED that helped, but since she has gotten home it has been getting worse, reports her head is 'going round and round.' Dizziness. Tylenol w/o relief. Nausea w/ decreased appetite.

## 2021-10-22 NOTE — ED Notes (Signed)
Pt took a few steps in room and then stated she was very dizzy and extremely nervous. Pt got upset and emotional and stated "Im sick, Im sick." When she took a few steps her O2 sats went down to 93% at the lowest. Pt back in bed comfortably and O2 sats are back up to 99%.

## 2021-10-22 NOTE — ED Provider Triage Note (Signed)
Emergency Medicine Provider Triage Evaluation Note  Erika Case , a 70 y.o. female  was evaluated in triage.  Pt complains of headache.  Review of Systems  Positive: Headache, dizzy, nausea Negative: Fever, cold sxs, dysuria, focal numbness, focal weakness  Physical Exam  BP 126/85 (BP Location: Right Arm)    Pulse 99    Temp 98 F (36.7 C) (Oral)    Resp 16    Ht 5\' 6"  (1.676 m)    Wt 68 kg    SpO2 100%    BMI 24.21 kg/m  Gen:   Awake, no distress   Resp:  Normal effort  MSK:   Moves extremities without difficulty  Other:    Medical Decision Making  Medically screening exam initiated at 12:14 PM.  Appropriate orders placed.  Erika Case was informed that the remainder of the evaluation will be completed by another provider, this initial triage assessment does not replace that evaluation, and the importance of remaining in the ED until their evaluation is complete.  Gradual onset of persistent throbbing headache along with vertiginous sxs x 5 days.     , PA-C 10/22/21 1215

## 2021-10-23 ENCOUNTER — Emergency Department (HOSPITAL_COMMUNITY): Payer: Medicare Other

## 2021-10-23 DIAGNOSIS — U071 COVID-19: Secondary | ICD-10-CM | POA: Diagnosis not present

## 2021-10-23 LAB — HEPATIC FUNCTION PANEL
ALT: 21 U/L (ref 0–44)
AST: 16 U/L (ref 15–41)
Albumin: 4.1 g/dL (ref 3.5–5.0)
Alkaline Phosphatase: 71 U/L (ref 38–126)
Bilirubin, Direct: <0.1 mg/dL (ref 0.0–0.2)
Total Bilirubin: 0.6 mg/dL (ref 0.3–1.2)
Total Protein: 7.4 g/dL (ref 6.5–8.1)

## 2021-10-23 LAB — LACTIC ACID, PLASMA
Lactic Acid, Venous: 2.6 mmol/L (ref 0.5–1.9)
Lactic Acid, Venous: 0.8 mmol/L (ref 0.5–1.9)

## 2021-10-23 MED ORDER — ONDANSETRON 4 MG PO TBDP: 4.0000 mg | ORAL_TABLET | Freq: Three times a day (TID) | ORAL | 0 refills | Status: DC | PRN

## 2021-10-23 NOTE — ED Notes (Signed)
Patient verbalized understanding of discharge instructions.  ?

## 2021-10-23 NOTE — ED Notes (Signed)
Pt will not keep her BP cuff on and appears to becoming agitated. States it is hurting her arm and she wants all of it off.

## 2021-10-23 NOTE — Discharge Instructions (Addendum)
Today your CAT scan was reassuring. Your headache appears to be due to COVID. This is a very common symptom for people with COVID that have. Please make sure you are drinking plenty water and staying well-hydrated. Please take Tylenol as directed below to help with any body aches or fevers. As we discussed since your body has not previously seen the COVID virus as you have not previously had COVID and did not get vaccinated it may take 7 to 10 days for you to start feeling better.  Your chest x-ray was reassuring also.  It does show you have a hiatal hernia which is a common incidental finding.  This is not concerning or dangerous on its own.  If you develop worsening shortness of breath, chest pain, or have other concerns please seek additional medical care and evaluation.  Please take Tylenol (acetaminophen) to relieve your pain.  You may take tylenol, up to 1,000 mg (two extra strength pills).  Do not take more than 3,000 mg tylenol in a 24 hour period.  Please check all medication labels as many medications such as pain and cold medications may contain tylenol. Please do not drink alcohol while taking this medication.

## 2021-10-27 ENCOUNTER — Ambulatory Visit: Payer: Self-pay

## 2021-10-27 NOTE — Telephone Encounter (Signed)
Summary: covid advice   Pt called and stated that she would like to get tested for Covid and get some clinical advice       Chief Complaint: requesting covid testing.  Symptoms: headache, dizziness, blurred vision weakness, could not eat, no symptoms now but blurred vision, slight headache . Reports her BP is fine but would not take .  Frequency: few days ago  Pertinent Negatives: Patient denies dizziness, cough, fever, sore throat Disposition: [] ED /[] Urgent Care (no appt availability in office) / [] Appointment(In office/virtual)/ []  [facility]/ [] Home Care/ [] Refused Recommended Disposition /[] [facility]/ [x]  Follow-up with PCP Additional Notes:  Please advise if patient can come for covid testing. Not all symptoms related to covid. Recommended PCE and gave patient address and #.         Reason for Disposition  [1] MODERATE headache (e.g., interferes with normal activities) AND [2] present > 24 hours AND [3] unexplained  (Exceptions: analgesics not tried, typical migraine, or headache part of viral illness)  Answer Assessment - Initial Assessment Questions 1. LOCATION: "Where does it hurt?"      Headache  2. ONSET: "When did the headache start?" (Minutes, hours or days)      A few days ago  3. PATTERN: "Does the pain come and go, or has it been constant since it started?"     Comes and goes  4. SEVERITY: "How bad is the pain?" and "What does it keep you from doing?"  (e.g., Scale 1-10; mild, moderate, or severe)   - MILD (1-3): doesn't interfere with normal activities    - MODERATE (4-7): interferes with normal activities or awakens from sleep    - SEVERE (8-10): excruciating pain, unable to do any normal activities        Moderate  5. RECURRENT SYMPTOM: "Have you ever had headaches before?" If Yes, ask: "When was the last time?" and "What happened that time?"      Yes  6. CAUSE: "What do you think is causing the headache?"     Covid  7. MIGRAINE:  "Have you been diagnosed with migraine headaches?" If Yes, ask: "Is this headache similar?"      na 8. HEAD INJURY: "Has there been any recent injury to the head?"      na 9. OTHER SYMPTOMS: "Do you have any other symptoms?" (fever, stiff neck, eye pain, sore throat, cold symptoms)     Blurred vision 10. PREGNANCY: "Is there any chance you are pregnant?" "When was your last menstrual period?"       na  Protocols used: [facility]

## 2021-10-27 NOTE — Telephone Encounter (Signed)
Pt called and stated that she would like to get tested for Covid and get some clinical advice.           Unable to complete call.

## 2021-12-25 ENCOUNTER — Ambulatory Visit: Payer: Medicaid Other | Admitting: Internal Medicine

## 2021-12-30 ENCOUNTER — Other Ambulatory Visit: Payer: Self-pay

## 2022-01-15 ENCOUNTER — Ambulatory Visit: Payer: Medicaid Other | Admitting: Physician Assistant

## 2022-02-03 ENCOUNTER — Other Ambulatory Visit: Payer: Self-pay | Admitting: Internal Medicine

## 2022-02-03 DIAGNOSIS — I10 Essential (primary) hypertension: Secondary | ICD-10-CM

## 2022-02-03 MED ORDER — AMLODIPINE BESYLATE 5 MG PO TABS: 5.0000 mg | ORAL_TABLET | Freq: Every day | ORAL | 1 refills | Status: DC

## 2022-02-03 NOTE — Telephone Encounter (Signed)
Medication Refill - Medication: amLODipine (NORVASC) 5 MG tablet  ? ?Has the patient contacted their pharmacy? No. ? ?(Agent: If yes, when and what did the pharmacy advise?) Need new Rx refills ran out  ? ?Preferred Pharmacy (with phone number or street name):  ?[facility] ([address] [place] Phone:  [phone]  ?Fax:  [phone]  ?  ? ?Has the patient been seen for an appointment in the last year OR does the patient have an upcoming appointment? Yes.   ? ?Agent: Please be advised that RX refills may take up to 3 business days. We ask that you follow-up with your pharmacy. ? ?

## 2022-02-03 NOTE — Addendum Note (Signed)
Addended by: Hoyle Barr on: 02/03/2022 03:16 PM ? ? Modules accepted: Orders ? ?

## 2022-02-03 NOTE — Telephone Encounter (Signed)
Requested Prescriptions  ?Pending Prescriptions Disp Refills  ?? amLODipine (NORVASC) 5 MG tablet 30 tablet 1  ?  Sig: Take 1 tablet (5 mg total) by mouth daily.  ?  ? Cardiovascular: Calcium Channel Blockers 2 Passed - 02/03/2022  3:16 PM  ?  ?  Passed - Last BP in normal range  ?  BP Readings from Last 1 Encounters:  ?10/23/21 124/61  ?   ?  ?  Passed - Last Heart Rate in normal range  ?  Pulse Readings from Last 1 Encounters:  ?10/23/21 69  ?   ?  ?  Passed - Valid encounter within last 6 months  ?  Recent Outpatient Visits   ?      ? 5 months ago Essential hypertension  ? [facility] Marcine Matar, MD  ? 6 months ago Dizziness  ? [facility] [place], [place], [place]  ?  ?  ?Future Appointments   ?        ? In 2 days Sharon Seller, [place] [facility]  ?  ? ?  ?  ?  ?Signed Prescriptions Disp Refills  ? amLODipine (NORVASC) 5 MG tablet 30 tablet 1  ?  Sig: Take 1 tablet (5 mg total) by mouth daily.  ?  ? Cardiovascular: Calcium Channel Blockers 2 Passed - 02/03/2022  3:03 PM  ?  ?  Passed - Last BP in normal range  ?  BP Readings from Last 1 Encounters:  ?10/23/21 124/61  ?   ?  ?  Passed - Last Heart Rate in normal range  ?  Pulse Readings from Last 1 Encounters:  ?10/23/21 69  ?   ?  ?  Passed - Valid encounter within last 6 months  ?  Recent Outpatient Visits   ?      ? 5 months ago Essential hypertension  ? [facility] Marcine Matar, MD  ? 6 months ago Dizziness  ? [facility] [place], [place], [place]  ?  ?  ?Future Appointments   ?        ? In 2 days Sharon Seller, [place] [facility]  ?  ? ?  ?  ?  ? ? ?

## 2022-02-03 NOTE — Telephone Encounter (Signed)
Requested Prescriptions  ?Pending Prescriptions Disp Refills  ?? amLODipine (NORVASC) 5 MG tablet 30 tablet 1  ?  Sig: Take 1 tablet (5 mg total) by mouth daily.  ?  ? Cardiovascular: Calcium Channel Blockers 2 Passed - 02/03/2022  3:03 PM  ?  ?  Passed - Last BP in normal range  ?  BP Readings from Last 1 Encounters:  ?10/23/21 124/61  ?   ?  ?  Passed - Last Heart Rate in normal range  ?  Pulse Readings from Last 1 Encounters:  ?10/23/21 69  ?   ?  ?  Passed - Valid encounter within last 6 months  ?  Recent Outpatient Visits   ?      ? 5 months ago Essential hypertension  ? [facility] Ladell Pier, MD  ? 6 months ago Dizziness  ? [facility] [place], [place], [place]  ?  ?  ?Future Appointments   ?        ? In 2 days Thereasa Solo, [place]  ?  ? ?  ?  ?  ? ? ?

## 2022-02-05 ENCOUNTER — Ambulatory Visit: Payer: Medicaid Other | Admitting: Physician Assistant

## 2022-05-18 ENCOUNTER — Ambulatory Visit: Payer: Medicaid Other | Admitting: Internal Medicine

## 2022-05-28 ENCOUNTER — Ambulatory Visit: Payer: Medicare Other | Attending: Internal Medicine | Admitting: Physician Assistant

## 2022-05-28 ENCOUNTER — Encounter: Payer: Self-pay | Admitting: Physician Assistant

## 2022-05-28 VITALS — BP 115/77 | HR 72 | Wt 148.2 lb

## 2022-05-28 DIAGNOSIS — R634 Abnormal weight loss: Secondary | ICD-10-CM | POA: Insufficient documentation

## 2022-05-28 DIAGNOSIS — Z79899 Other long term (current) drug therapy: Secondary | ICD-10-CM | POA: Insufficient documentation

## 2022-05-28 DIAGNOSIS — F419 Anxiety disorder, unspecified: Secondary | ICD-10-CM | POA: Insufficient documentation

## 2022-05-28 DIAGNOSIS — G4700 Insomnia, unspecified: Secondary | ICD-10-CM | POA: Insufficient documentation

## 2022-05-28 DIAGNOSIS — F418 Other specified anxiety disorders: Secondary | ICD-10-CM

## 2022-05-28 DIAGNOSIS — I10 Essential (primary) hypertension: Secondary | ICD-10-CM | POA: Diagnosis present

## 2022-05-28 DIAGNOSIS — R519 Headache, unspecified: Secondary | ICD-10-CM | POA: Insufficient documentation

## 2022-05-28 DIAGNOSIS — R630 Anorexia: Secondary | ICD-10-CM | POA: Diagnosis not present

## 2022-05-28 MED ORDER — AMLODIPINE BESYLATE 5 MG PO TABS: 5.0000 mg | ORAL_TABLET | Freq: Every day | ORAL | 1 refills | Status: DC

## 2022-05-28 MED ORDER — HYDROXYZINE HCL 25 MG PO TABS: 25.0000 mg | ORAL_TABLET | Freq: Every evening | ORAL | 2 refills | Status: DC | PRN

## 2022-05-28 NOTE — Progress Notes (Signed)
Patient ID: Erika Case, female   DOB: [birth year], 71 y.o.   MRN: [id number]   Erika Case, is a 71 y.o. female  [id number]  [id number]  DOB - 1950-12-26  Chief Complaint  Patient presents with   poor appetite   Headache       Subjective:   Erika Case is a 71 y.o. female here today for BP RF.  She also mentions weight loss but after lengthy discussion(she is wearing a mask)-the appetite issues she is having is related to having all of her teeth pulled 2 months ago.  She has been having so many oral problems that she has to eat soft food and can't eat what she usu eats.  She gets her dentures next week.  Appetite is good she just doesn't want to eat the foods she is [place] to eat.    No problems updated.  ALLERGIES: No Known Allergies  PAST MEDICAL HISTORY: Past Medical History:  Diagnosis Date   Hypertension     MEDICATIONS AT HOME: Prior to Admission medications   Medication Sig Start Date End Date Taking? Authorizing Provider  acetaminophen (TYLENOL) 325 MG tablet Take 325 mg by mouth at bedtime as needed (Sleep). Tylenol PM.   Yes [provider]  fluconazole (DIFLUCAN) 150 MG tablet Take 1 tablet (150 mg total) by mouth daily. 08/29/21  Yes Marcine Matar, MD  ondansetron (ZOFRAN-ODT) 4 MG disintegrating tablet Take 1 tablet (4 mg total) by mouth every 8 (eight) hours as needed for nausea or vomiting. 10/23/21  Yes Cristina Gong, PA-C  amLODipine (NORVASC) 5 MG tablet Take 1 tablet (5 mg total) by mouth daily. 05/28/22   Anders Simmonds, PA-C  hydrOXYzine (ATARAX) 25 MG tablet Take 1 tablet (25 mg total) by mouth at bedtime as needed. 05/28/22   Kaikoa Magro, Marzella Schlein, PA-C    ROS: Neg HEENT Neg resp Neg cardiac Neg GI Neg GU Neg MS Neg psych Neg neuro  Objective:   Vitals:   05/28/22 0909  BP: 115/77  Pulse: 72  SpO2: 99%  Weight: 148 lb 3.2 oz (67.2 kg)   Exam General appearance : Awake, alert, not in any distress. Speech Clear. Not  toxic looking HEENT: Atraumatic and Normocephalic Neck: Supple, no JVD. No cervical lymphadenopathy.  Chest: Good air entry bilaterally, CTAB.  No rales/rhonchi/wheezing CVS: S1 S2 regular, no murmurs.  Extremities: B/L Lower Ext shows no edema, both legs are warm to touch Neurology: Awake alert, and oriented X 3, CN II-XII intact, Non focal Skin: No Rash  Data Review No results found for: "HGBA1C"  Assessment & Plan   1. Weight loss This is due to having teeth pulled and not enjoying the foods she used to eat.  She has to eat soft foods.  She gets dentures next week.  I have advised her to weigh and monitor as she gets her dentures and is [place] to graduate her diet.  If she continues to lose weight, she will schedule and appt.    2. Anxiety about health - hydrOXYzine (ATARAX) 25 MG tablet; Take 1 tablet (25 mg total) by mouth at bedtime as needed.  Dispense: 30 tablet; Refill: 2  3. Insomnia, unspecified type - hydrOXYzine (ATARAX) 25 MG tablet; Take 1 tablet (25 mg total) by mouth at bedtime as needed.  Dispense: 30 tablet; Refill: 2  4. Essential hypertension Controlled. - Comprehensive metabolic panel - amLODipine (NORVASC) 5 MG tablet; Take 1 tablet (5 mg total) by mouth daily.  Dispense: 90 tablet; Refill: 1    Return in about 6 months (around 11/28/2022) for assign patient a PCP here please.  The patient was given clear instructions to go to ER or return to medical center if symptoms don't improve, worsen or new problems develop. The patient verbalized understanding. The patient was told to call to get lab results if they haven't heard anything in the next week.      Georgian Co, PA-C [facility] [place], [place] [phone]   05/28/2022, 9:26 AM

## 2022-05-29 LAB — COMPREHENSIVE METABOLIC PANEL
ALT: 14 IU/L (ref 0–32)
AST: 19 IU/L (ref 0–40)
Albumin/Globulin Ratio: 1.4 (ref 1.2–2.2)
Albumin: 4.3 g/dL (ref 3.8–4.8)
Alkaline Phosphatase: 80 IU/L (ref 44–121)
BUN/Creatinine Ratio: 8 — ABNORMAL LOW (ref 12–28)
BUN: 8 mg/dL (ref 8–27)
Bilirubin Total: 0.3 mg/dL (ref 0.0–1.2)
CO2: 22 mmol/L (ref 20–29)
Calcium: 9.5 mg/dL (ref 8.7–10.3)
Chloride: 105 mmol/L (ref 96–106)
Creatinine, Ser: 1 mg/dL (ref 0.57–1.00)
Globulin, Total: 3 g/dL (ref 1.5–4.5)
Glucose: 87 mg/dL (ref 70–99)
Potassium: 3.5 mmol/L (ref 3.5–5.2)
Sodium: 142 mmol/L (ref 134–144)
Total Protein: 7.3 g/dL (ref 6.0–8.5)
eGFR: 60 mL/min/{1.73_m2} (ref >59)

## 2022-08-17 ENCOUNTER — Ambulatory Visit: Payer: Self-pay | Admitting: *Deleted

## 2022-08-17 NOTE — Telephone Encounter (Signed)
Reason for Disposition  [1] SEVERE pain (e.g., excruciating) AND [2] present > 1 hour  Answer Assessment - Initial Assessment Questions 1. LOCATION: "Where does it hurt?"      I'm having pain in my side on the right.  I'm taking Tylenol and ibuprofen.   2. RADIATION: "Does the pain shoot anywhere else?" (e.g., chest, back)     Hurting now over a week.  The medications help only a little.   3. ONSET: "When did the pain begin?" (e.g., minutes, hours or days ago)      It's in my rib cage and side.    I may have pulled a muscle.   No bumpps or lumps are felt.   I get a sharp pain if I press hard on my side.  4. SUDDEN: "Gradual or sudden onset?"     No diarrhea or vomiting.    I feel fine except for this pain.   5. PATTERN "Does the pain come and go, or is it constant?"    - If it comes and goes: "How long does it last?" "Do you have pain now?"     (Note: Comes and goes means the pain is intermittent. It goes away completely between bouts.)    - If constant: "Is it getting better, staying the same, or getting worse?"      (Note: Constant means the pain never goes away completely; most serious pain is constant and gets worse.)      It's constant when I'm moving.    6. SEVERITY: "How bad is the pain?"  (e.g., Scale 1-10; mild, moderate, or severe)    - MILD (1-3): Doesn't interfere with normal activities, abdomen soft and not tender to touch.     - MODERATE (4-7): Interferes with normal activities or awakens from sleep, abdomen tender to touch.     - SEVERE (8-10): Excruciating pain, doubled over, unable to do any normal activities.       Severe 7. RECURRENT SYMPTOM: "Have you ever had this type of stomach pain before?" If Yes, ask: "When was the last time?" and "What happened that time?"      No never had this before 8. CAUSE: "What do you think is causing the stomach pain?"     Maybe pulled a muscle 9. RELIEVING/AGGRAVATING FACTORS: "What makes it better or worse?" (e.g., antacids, bending or  twisting motion, bowel movement)     The medicines are not really helping.   I'm taking 2 of them but not really helping. 10. OTHER SYMPTOMS: "Do you have any other symptoms?" (e.g., back pain, diarrhea, fever, urination pain, vomiting)       No 11. PREGNANCY: "Is there any chance you are pregnant?" "When was your last menstrual period?"       N/A due to age  Protocols used: Abdominal Pain - [facility]

## 2022-08-17 NOTE — Telephone Encounter (Signed)
  Chief Complaint: Severe right sided pain that goes up into her rib cage.  Refusing ED referral. Symptoms: Pain in right side into her rib cage for over a week now.   Not getting better.   Taking Tylenol and ibuprofen but they are not helping. Frequency: Constantly and worse when she is up moving around. Pertinent Negatives: Patient denies vomiting, diarrhea, accidents or injuries.    "I think I've pulled a muscle. Disposition: [x] ED /[] Urgent Care (no appt availability in office) / [] Appointment(In office/virtual)/ []  [facility]/ [] Home Care/ [] Refused Recommended Disposition /[] [facility]/ []  Follow-up with PCP Additional Notes: I have referred this pt to the ED however she is refusing to go.   "I'm not going to no emergency room if I can help it".    "It's too crowded and a long wait".  She called while [facility] was closed for lunch.   I have forwarded her notes high priority.  She last saw Freeman Caldron in August 2023.

## 2022-08-21 ENCOUNTER — Other Ambulatory Visit: Payer: Self-pay

## 2022-08-21 ENCOUNTER — Other Ambulatory Visit: Payer: Self-pay | Admitting: Physician Assistant

## 2022-08-21 DIAGNOSIS — G4700 Insomnia, unspecified: Secondary | ICD-10-CM

## 2022-08-21 DIAGNOSIS — F418 Other specified anxiety disorders: Secondary | ICD-10-CM

## 2022-08-21 NOTE — Telephone Encounter (Signed)
No appointments available in the office for patient to be scheduled with. Advised of the Mobile [facility] option on Tuesday. Given location and hours of availability.   Encouraged UC or ED if sx's worsen.

## 2022-08-21 NOTE — Telephone Encounter (Signed)
Requested Prescriptions  Pending Prescriptions Disp Refills   hydrOXYzine (ATARAX) 25 MG tablet [Pharmacy Med Name: hydrOXYzine HCl 25 MG Oral Tablet] 90 tablet 1    Sig: TAKE 1 TABLET BY MOUTH AT BEDTIME AS NEEDED     Ear, Nose, and Throat:  Antihistamines 2 Passed - 08/21/2022  9:15 AM      Passed - Cr in normal range and within 360 days    Creatinine, Ser  Date Value Ref Range Status  05/28/2022 1.00 0.57 - 1.00 mg/dL Final         Passed - Valid encounter within last 12 months    Recent Outpatient Visits           2 months ago Anxiety about health   [place], Levada Dy M, [place]   11 months ago Essential hypertension   [place], Deborah B, [place]   1 year ago [facility] Mayers, [place], [place]       Future Appointments             In 3 months Wynetta Emery, Dalbert Batman, MD [place]

## 2022-08-21 NOTE — Telephone Encounter (Signed)
Left message on voicemail to return call.

## 2022-09-28 ENCOUNTER — Other Ambulatory Visit: Payer: Self-pay | Admitting: Physician Assistant

## 2022-09-28 DIAGNOSIS — I10 Essential (primary) hypertension: Secondary | ICD-10-CM

## 2022-10-16 IMAGING — CT CT HEAD W/O CM
3 of 4 series · 14 of 47 positions shown, 16 images · non-contrast
Comparison: 10/15/2011

CLINICAL DATA: Headache.

EXAM:
CT HEAD WITHOUT CONTRAST
TECHNIQUE: Contiguous axial images were obtained from the base of the skull
through the vertex without intravenous contrast.

[Series 5: coronal soft tissue · coronal · 0.32mm/px · 3 of 64 slices shown]
[im 22/64  brain]
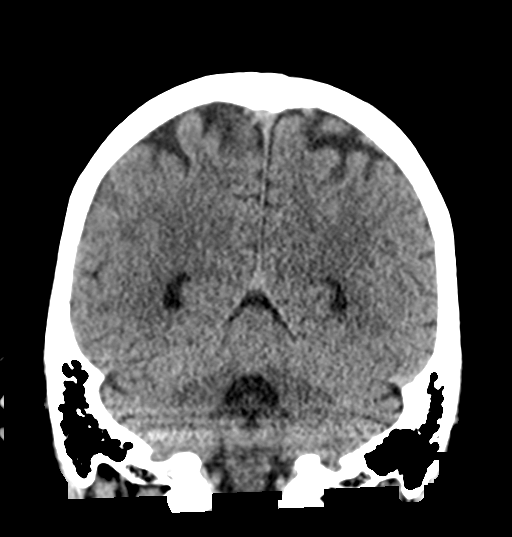
[im 29/64  brain]
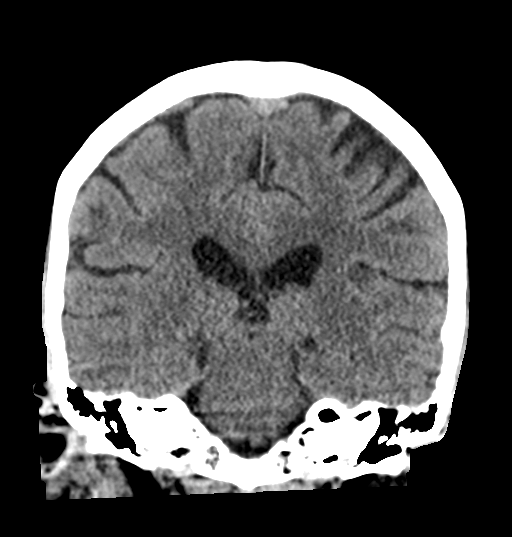
[im 36/64  brain]
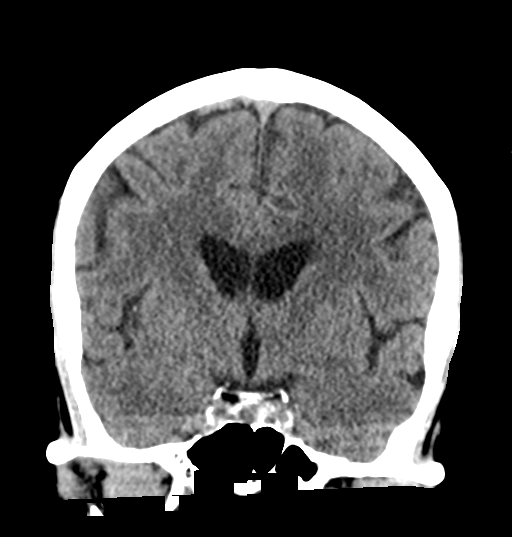

[Series 6: sagittal soft tissue · sagittal · 0.34mm/px · 3 of 55 slices shown]
[im 19/55  brain]
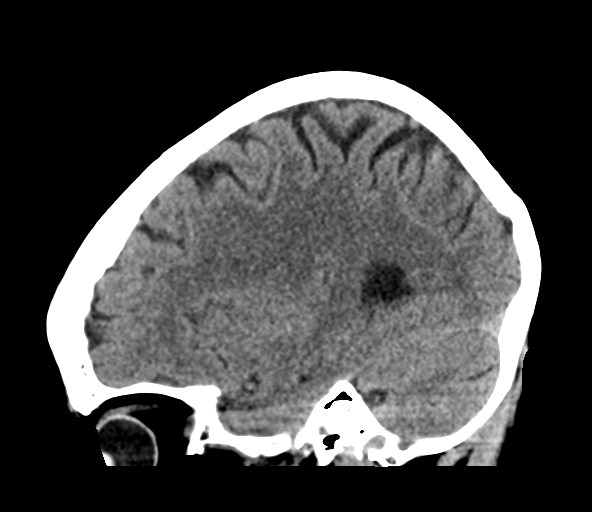
[im 28/55  brain]
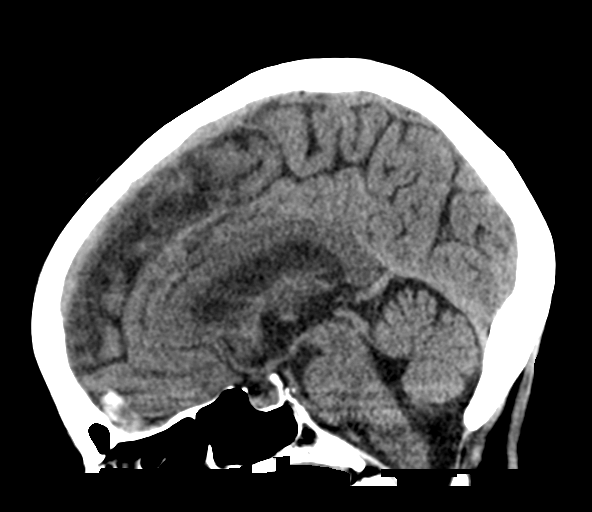
[im 37/55  brain]
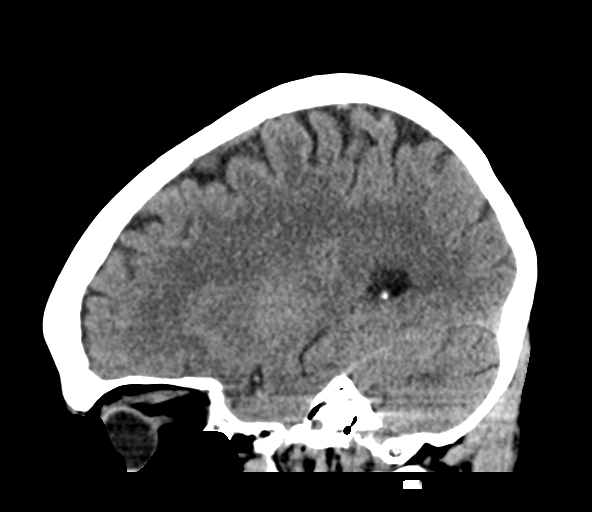

[Series 7: true axial · axial · 0.32mm/px · z∈[-138,-12]mm · 8 of 56 slices shown, 10 images]
[im 7/56  brain]
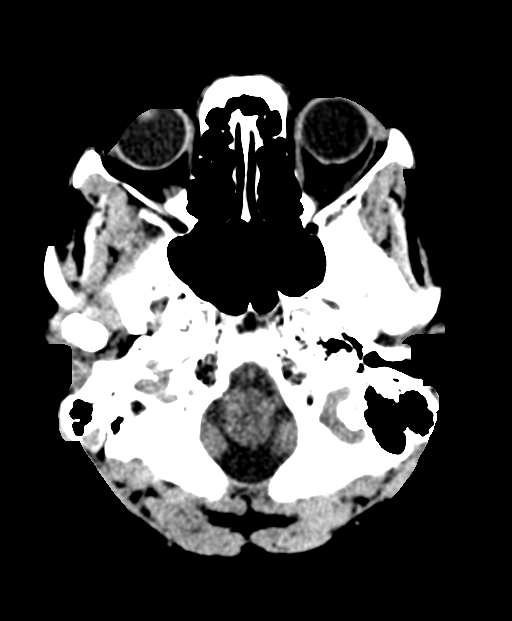
[im 7/56  bone]
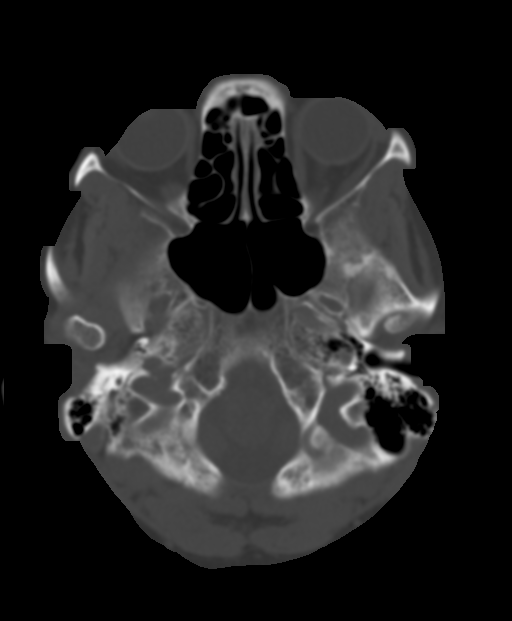
[im 13/56  brain]
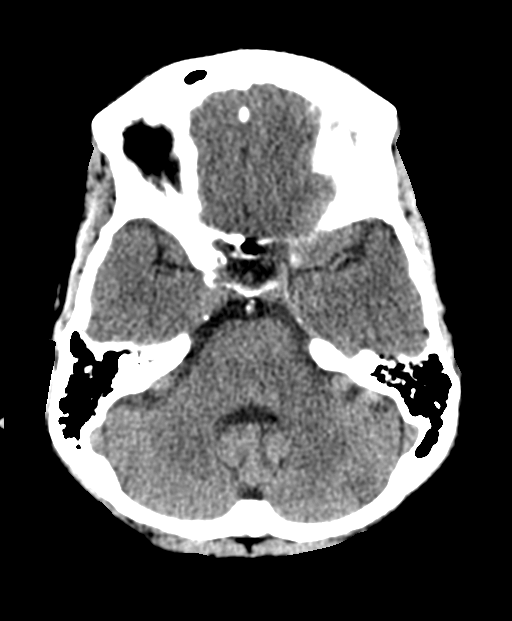
[im 19/56  brain]
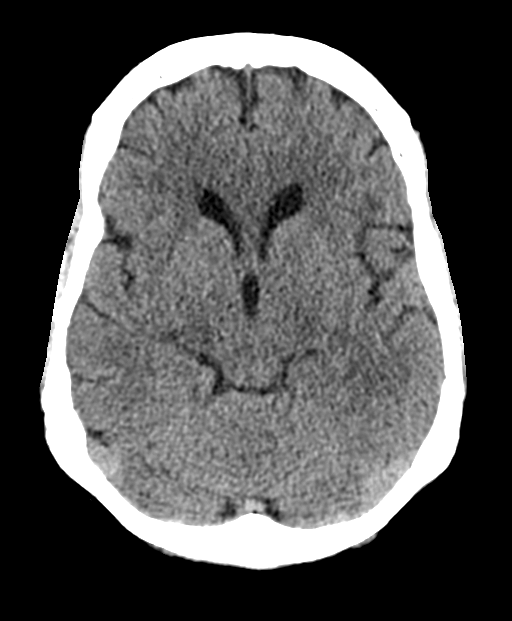
[im 25/56  brain]
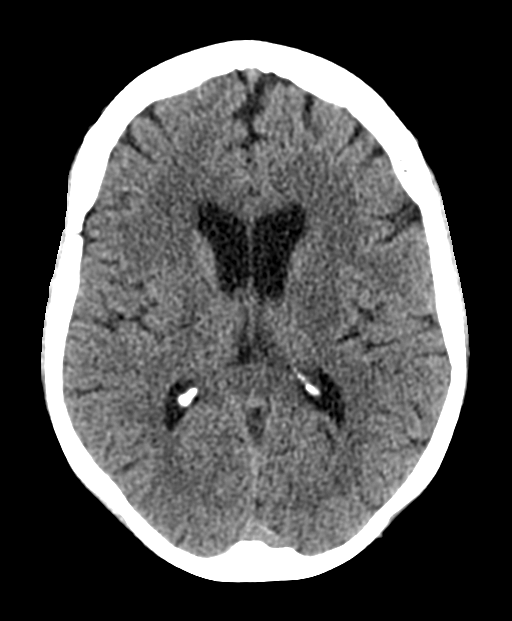
[im 31/56  brain]
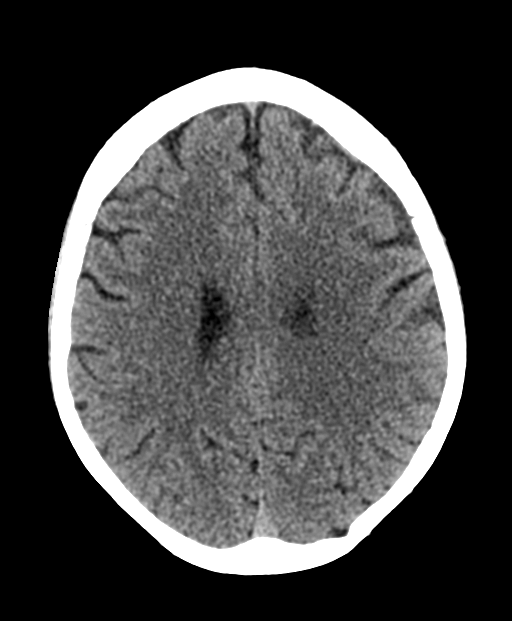
[im 31/56  bone]
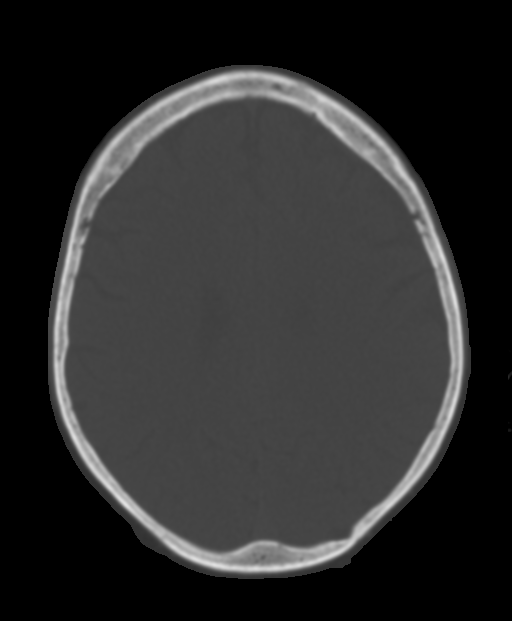
[im 37/56  brain]
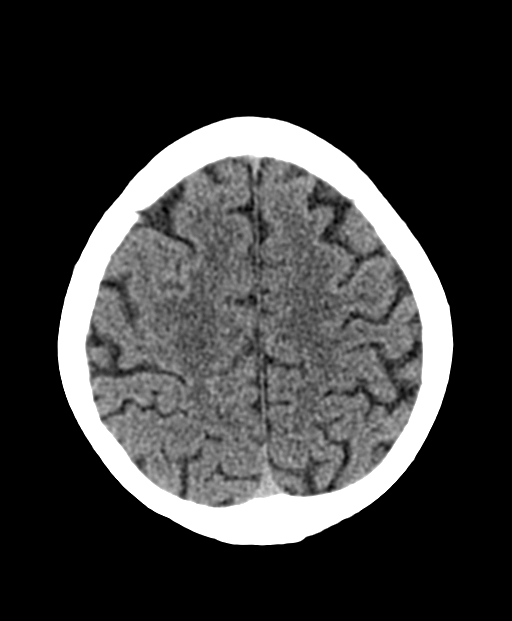
[im 43/56  brain]
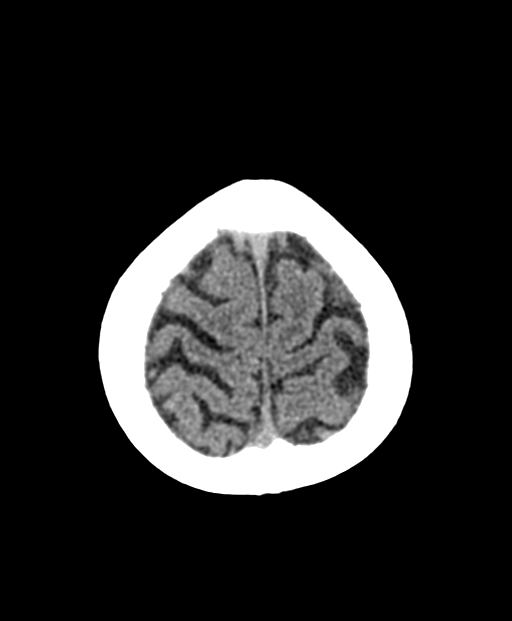
[im 49/56  brain]
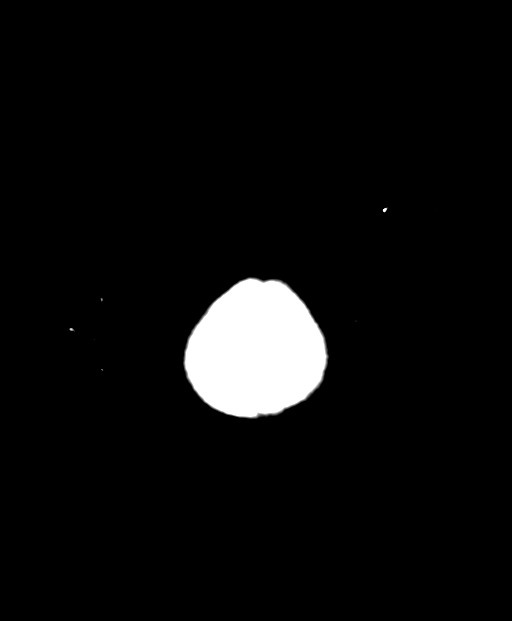

[14 of 47 positions shown; findings below may reference images not displayed]

FINDINGS: Brain: No evidence of acute infarction, hemorrhage, hydrocephalus,
extra-axial collection or mass lesion/mass effect.

Vascular: No hyperdense vessel or unexpected calcification.

Skull: Normal. Negative for fracture or focal lesion.

Sinuses/Orbits: Visualized globes and orbits are unremarkable.
Visualized sinuses are clear.

Other: None.
IMPRESSION: Normal unenhanced CT scan of the brain.

## 2022-11-30 ENCOUNTER — Encounter: Payer: Self-pay | Admitting: Internal Medicine

## 2022-11-30 ENCOUNTER — Ambulatory Visit: Payer: Medicare Other | Attending: Internal Medicine | Admitting: Internal Medicine

## 2022-11-30 VITALS — BP 113/77 | HR 86 | Temp 98.4°F | Ht 66.0 in | Wt 157.0 lb

## 2022-11-30 DIAGNOSIS — Z1231 Encounter for screening mammogram for malignant neoplasm of breast: Secondary | ICD-10-CM | POA: Diagnosis not present

## 2022-11-30 DIAGNOSIS — Z79899 Other long term (current) drug therapy: Secondary | ICD-10-CM | POA: Diagnosis not present

## 2022-11-30 DIAGNOSIS — M130 Polyarthritis, unspecified: Secondary | ICD-10-CM | POA: Diagnosis not present

## 2022-11-30 DIAGNOSIS — H905 Unspecified sensorineural hearing loss: Secondary | ICD-10-CM | POA: Insufficient documentation

## 2022-11-30 DIAGNOSIS — Z2821 Immunization not carried out because of patient refusal: Secondary | ICD-10-CM

## 2022-11-30 DIAGNOSIS — I10 Essential (primary) hypertension: Secondary | ICD-10-CM

## 2022-11-30 DIAGNOSIS — G4700 Insomnia, unspecified: Secondary | ICD-10-CM | POA: Diagnosis not present

## 2022-11-30 DIAGNOSIS — F411 Generalized anxiety disorder: Secondary | ICD-10-CM

## 2022-11-30 DIAGNOSIS — F419 Anxiety disorder, unspecified: Secondary | ICD-10-CM | POA: Diagnosis not present

## 2022-11-30 DIAGNOSIS — Z1211 Encounter for screening for malignant neoplasm of colon: Secondary | ICD-10-CM

## 2022-11-30 MED ORDER — MELOXICAM 15 MG PO TABS: 15.0000 mg | ORAL_TABLET | Freq: Every day | ORAL | 3 refills | Status: DC

## 2022-11-30 MED ORDER — AMLODIPINE BESYLATE 5 MG PO TABS: 5.0000 mg | ORAL_TABLET | Freq: Every day | ORAL | 1 refills | Status: DC

## 2022-11-30 MED ORDER — HYDROXYZINE HCL 25 MG PO TABS: 25.0000 mg | ORAL_TABLET | Freq: Every evening | ORAL | 1 refills | Status: DC | PRN

## 2022-11-30 NOTE — Progress Notes (Signed)
Patient ID: Erika Case, female    DOB: April 11, 1951  MRN: [id number]  CC: Hypertension (HTN f/u. Med refills. /Ins no longer covering hydroxizine - requesting med change./No to flu vax. )   Subjective: Erika Case is a 72 y.o. female who presents for chronic ds management Her concerns today include:  Patient with history of HTN, high-frequency sensory neuronal hearing loss, insomnia, anxiety   HYPERTENSION Currently taking: see medication list.  Norvasc 10 mg Med Adherence: [x]$  Yes    []$  No Medication side effects: []$  Yes    [x]$  No Adherence with salt restriction: [x]$  Yes    []$  No Home Monitoring?: []$  Yes    [x]$  No Monitoring Frequency:  Home BP results range:  SOB? []$  Yes    [x]$  No Chest Pain?: []$  Yes    [x]$  No Leg swelling?: []$  Yes    [x]$  No Headaches?: []$  Yes    [x]$  No Dizziness? []$  Yes    []$  No Comments:   C/o pain and stiffness in RT index finger and both wrist x 1 mth.  Worse in mornings and nights;  better during the day. Endorses some swelling in the RT index finger but not the wrists. Tried several OTC creams that do not help.   Hydroxyzine helps her to relax and fall asleep because the pain is so bad at nights.  Pt was prescribed the Hydroxyzine for anxiety and insomnia by PA on last visit.  Her insurance no longer covers; paying out of pocket for the hydroxyzine which she does not mind doing because she finds the medication helpful.  Wgh loss:  she has gained wgh since last visit.  Now has dentures.  Appetite has improved.  HM: Declines flu shot, PCV 20 and shingles.  Needs MMG. Due for colon CA screen; no fhx of colon CA.  Never had c-scope Patient Active Problem List   Diagnosis Date Noted   Dizziness 07/31/2021   Elevated blood pressure reading in office without diagnosis of hypertension 07/31/2021   Tinnitus of left ear 07/31/2021   Neuropathy 07/31/2021   Insomnia 07/31/2021   Anxiety about health 07/31/2021   Acute pyelonephritis 02/01/2021     Current  Outpatient Medications on File Prior to Visit  Medication Sig Dispense Refill   acetaminophen (TYLENOL) 325 MG tablet Take 325 mg by mouth at bedtime as needed (Sleep). Tylenol PM.     amLODipine (NORVASC) 5 MG tablet Take 1 tablet by mouth once daily 90 tablet 0   fluconazole (DIFLUCAN) 150 MG tablet Take 1 tablet (150 mg total) by mouth daily. 1 tablet 0   hydrOXYzine (ATARAX) 25 MG tablet TAKE 1 TABLET BY MOUTH AT BEDTIME AS NEEDED 90 tablet 1   ondansetron (ZOFRAN-ODT) 4 MG disintegrating tablet Take 1 tablet (4 mg total) by mouth every 8 (eight) hours as needed for nausea or vomiting. 10 tablet 0   No current facility-administered medications on file prior to visit.    No Known Allergies  Social History   Socioeconomic History   Marital status: Single    Spouse name: Not on file   Number of children: 2   Years of education: Not on file   Highest education level: Not on file  Occupational History   Occupation: [employer]  Tobacco Use   Smoking status: Never   Smokeless tobacco: Never  Vaping Use   Vaping Use: Never used  Substance and Sexual Activity   Alcohol use: No   Drug use: No  Sexual activity: Not Currently  Other Topics Concern   Not on file  Social History Narrative   Not on file   Social Determinants of Health   Financial Resource Strain: Not on file  Food Insecurity: Not on file  Transportation Needs: Not on file  Physical Activity: Not on file  Stress: Not on file  Social Connections: Not on file  Intimate Partner Violence: Not on file    No family history on file.  No past surgical history on file.  ROS: Review of Systems Negative except as stated above  PHYSICAL EXAM: BP 113/77 (BP Location: Left Arm, Patient Position: Sitting, Cuff Size: Normal)   Pulse 86   Temp 98.4 F (36.9 C) (Oral)   Ht 5' 6"$  (1.676 m)   Wt 157 lb (71.2 kg)   SpO2 99%   BMI 25.34 kg/m  Wt Readings from Last 3 Encounters:  11/30/22 157 lb (71.2 kg)  05/28/22  148 lb 3.2 oz (67.2 kg)  10/22/21 160 lb (72.6 kg)     Physical Exam  General appearance -we have added a medic Mental status - normal mood, behavior, speech, dress, motor activity, and thought processes Neck - supple, no significant adenopathy Chest - clear to auscultation, no wheezes, rales or rhonchi, symmetric air entry Heart - normal rate, regular rhythm, normal S1, S2, no murmurs, rubs, clicks or gallops Musculoskeletal -patient has mild swelling of the right index finger.  She has mild enlargement of PIP joints on the right hand.  No enlargement noted of the wrist joints.  She has good range of motion at the wrist joints. Extremities -no lower extremity edema.      Latest Ref Rng & Units 05/28/2022    9:35 AM 10/23/2021   12:30 AM 10/22/2021    9:51 PM  CMP  Glucose 70 - 99 mg/dL 87   87   BUN 8 - 27 mg/dL 8   14   Creatinine 0.57 - 1.00 mg/dL 1.00   1.07   Sodium 134 - 144 mmol/L 142   138   Potassium 3.5 - 5.2 mmol/L 3.5   3.5   Chloride 96 - 106 mmol/L 105   108   CO2 20 - 29 mmol/L 22   20   Calcium 8.7 - 10.3 mg/dL 9.5   9.4   Total Protein 6.0 - 8.5 g/dL 7.3  7.4    Total Bilirubin 0.0 - 1.2 mg/dL 0.3  0.6    Alkaline Phos 44 - 121 IU/L 80  71    AST 0 - 40 IU/L 19  16    ALT 0 - 32 IU/L 14  21     Lipid Panel  No results found for: "CHOL", "TRIG", "HDL", "CHOLHDL", "VLDL", "LDLCALC", "LDLDIRECT"  CBC    Component Value Date/Time   WBC 5.1 10/22/2021 2151   RBC 5.01 10/22/2021 2151   HGB 14.0 10/22/2021 2151   HCT 41.9 10/22/2021 2151   PLT 208 10/22/2021 2151   MCV 83.6 10/22/2021 2151   MCH 27.9 10/22/2021 2151   MCHC 33.4 10/22/2021 2151   RDW 12.7 10/22/2021 2151   LYMPHSABS 1.4 10/17/2021 2220   MONOABS 0.2 10/17/2021 2220   EOSABS 0.0 10/17/2021 2220   BASOSABS 0.0 10/17/2021 2220    ASSESSMENT AND PLAN: 1. Essential hypertension At goal.  Continue Norvasc 5 mg daily. - CBC - Comprehensive metabolic panel - Lipid panel - amLODipine (NORVASC)  5 MG tablet; Take 1 tablet (5 mg total) by mouth  daily.  Dispense: 90 tablet; Refill: 1  2. Polyarthritis Given the location of her joint pains, we will check for inflammation and screen for RA.  We have started her on meloxicam. - Sedimentation Rate - ANA w/Reflex - Rheumatoid factor - CYCLIC CITRUL PEPTIDE ANTIBODY, IGG/IGA - meloxicam (MOBIC) 15 MG tablet; Take 1 tablet (15 mg total) by mouth daily.  Dispense: 30 tablet; Refill: 3  3. Anxiety state - hydrOXYzine (ATARAX) 25 MG tablet; Take 1 tablet (25 mg total) by mouth at bedtime as needed.  Dispense: 90 tablet; Refill: 1  4. Encounter for screening mammogram for malignant neoplasm of breast - MM Digital Screening; Future  5. Screening for colon cancer Patient prefers to have fit test.  She is unable to give a stool sample today.  She will take the kit home but I told her that the same day that she uses it, she must return it to the lab. - Fecal occult blood, imunochemical([facility])  6. Insomnia, unspecified type - hydrOXYzine (ATARAX) 25 MG tablet; Take 1 tablet (25 mg total) by mouth at bedtime as needed.  Dispense: 90 tablet; Refill: 1  7. Influenza vaccination declined  8. Pneumococcal vaccination declined    Patient was given the opportunity to ask questions.  Patient verbalized understanding of the plan and was Hoot to repeat key elements of the plan.   This documentation was completed using Radio producer.  Any transcriptional errors are unintentional.  No orders of the defined types were placed in this encounter.    Requested Prescriptions    No prescriptions requested or ordered in this encounter    No follow-ups on file.  Karle Plumber, MD, FACP

## 2022-11-30 NOTE — Patient Instructions (Signed)
Patient called meloxicam 15 mg daily to help decrease arthritis symptoms in the hands/wrist.  You will be called with an appointment for your Medicare wellness visit.

## 2022-12-02 ENCOUNTER — Telehealth: Payer: Self-pay | Admitting: *Deleted

## 2022-12-02 ENCOUNTER — Telehealth: Payer: Self-pay | Admitting: Internal Medicine

## 2022-12-02 DIAGNOSIS — M069 Rheumatoid arthritis, unspecified: Secondary | ICD-10-CM | POA: Insufficient documentation

## 2022-12-02 DIAGNOSIS — M05741 Rheumatoid arthritis with rheumatoid factor of right hand without organ or systems involvement: Secondary | ICD-10-CM

## 2022-12-02 DIAGNOSIS — N1831 Chronic kidney disease, stage 3a: Secondary | ICD-10-CM

## 2022-12-02 DIAGNOSIS — N183 Chronic kidney disease, stage 3 unspecified: Secondary | ICD-10-CM | POA: Insufficient documentation

## 2022-12-02 NOTE — Telephone Encounter (Signed)
Pt returned call for results, result note read to pt, verbalizes understanding. Please call prednisone in to [facility]   "Let patient know that lab results confirms that she has rheumatoid arthritis.  I will refer her to a rheumatologist for further evaluation and management. Liver function test normal.  Blood cell counts are normal. Cholesterol levels are normal. Kidney function is not 100% but stable.  Avoid OTC pain medications but okay to use Tylenol.  Hold off on taking the medication called Meloxicam as well that I had prescribed for the arthritis. Will put her on low dose prednisone until she sees the rheumatologist.  Please let me know once you have spoken with her."

## 2022-12-02 NOTE — Telephone Encounter (Signed)
Phone call placed to patient this morning x 2 to go over lab results.  I left a message informing her of who I am and that I was calling to go over lab results.  I request that she give [place] a call.  I will also have my CMA  reach out to her later.  If patient calls back, please see my lab results note that was sent to my CMA. I called [facility] at [facility] where prescription for meloxicam was sent.  They told me that patient picked up the medication on 11/30/2022.  I requested that they cancel all remaining refills on the prescription.

## 2022-12-03 LAB — COMPREHENSIVE METABOLIC PANEL
ALT: 12 IU/L (ref 0–32)
AST: 19 IU/L (ref 0–40)
Albumin/Globulin Ratio: 1.4 (ref 1.2–2.2)
Albumin: 4.4 g/dL (ref 3.8–4.8)
Alkaline Phosphatase: 100 IU/L (ref 44–121)
BUN/Creatinine Ratio: 8 — ABNORMAL LOW (ref 12–28)
BUN: 9 mg/dL (ref 8–27)
Bilirubin Total: 0.3 mg/dL (ref 0.0–1.2)
CO2: 23 mmol/L (ref 20–29)
Calcium: 9.7 mg/dL (ref 8.7–10.3)
Chloride: 101 mmol/L (ref 96–106)
Creatinine, Ser: 1.06 mg/dL — ABNORMAL HIGH (ref 0.57–1.00)
Globulin, Total: 3.2 g/dL (ref 1.5–4.5)
Glucose: 94 mg/dL (ref 70–99)
Potassium: 4 mmol/L (ref 3.5–5.2)
Sodium: 140 mmol/L (ref 134–144)
Total Protein: 7.6 g/dL (ref 6.0–8.5)
eGFR: 56 mL/min/{1.73_m2} — ABNORMAL LOW (ref >59)

## 2022-12-03 LAB — LIPID PANEL
Chol/HDL Ratio: 2 ratio (ref 0.0–4.4)
Cholesterol, Total: 183 mg/dL (ref 100–199)
HDL: 90 mg/dL (ref >39)
LDL Chol Calc (NIH): 80 mg/dL (ref 0–99)
Triglycerides: 71 mg/dL (ref 0–149)
VLDL Cholesterol Cal: 13 mg/dL (ref 5–40)

## 2022-12-03 LAB — SEDIMENTATION RATE: Sed Rate: 18 mm/hr (ref 0–40)

## 2022-12-03 LAB — CBC
Hematocrit: 39.7 % (ref 34.0–46.6)
Hemoglobin: 12.9 g/dL (ref 11.1–15.9)
MCH: 27.2 pg (ref 26.6–33.0)
MCHC: 32.5 g/dL (ref 31.5–35.7)
MCV: 84 fL (ref 79–97)
Platelets: 249 10*3/uL (ref 150–450)
RBC: 4.75 x10E6/uL (ref 3.77–5.28)
RDW: 12.6 % (ref 11.7–15.4)
WBC: 3.6 10*3/uL (ref 3.4–10.8)

## 2022-12-03 LAB — CYCLIC CITRUL PEPTIDE ANTIBODY, IGG/IGA: Cyclic Citrullin Peptide Ab: 64 units — ABNORMAL HIGH (ref 0–19)

## 2022-12-03 LAB — ANA W/REFLEX: ANA Titer 1: NEGATIVE

## 2022-12-03 LAB — RHEUMATOID FACTOR: Rheumatoid fact SerPl-aCnc: 267.9 IU/mL — ABNORMAL HIGH (ref <14.0)

## 2022-12-03 MED ORDER — PREDNISONE 1 MG PO TABS: 2.0000 mg | ORAL_TABLET | Freq: Every day | ORAL | 1 refills | Status: DC

## 2022-12-03 NOTE — Telephone Encounter (Signed)
-----   Message from Cheral Almas, RN sent at 12/03/2022  1:33 PM EST ----- Patient returned our call. Reviewed provider's note with pt.   Pt had already received results. Pt went to pharmacy yesterday and prednisone was not there.  Note already sent to office regarding this. PT would like this prescription called in ASAP.  Please advise.

## 2022-12-03 NOTE — Telephone Encounter (Signed)
FYI

## 2022-12-04 NOTE — Telephone Encounter (Signed)
Called & spoke tot he patient. Verified name & DOB. Informed that medication has been sent to the pharmacy. Patient expressed verbal understanding.

## 2022-12-16 ENCOUNTER — Ambulatory Visit: Payer: Medicare Other | Attending: Family Medicine

## 2022-12-16 DIAGNOSIS — Z0000 Encounter for general adult medical examination without abnormal findings: Secondary | ICD-10-CM | POA: Diagnosis not present

## 2022-12-16 NOTE — Patient Instructions (Addendum)
Erika Case , Thank you for taking time to come for your Medicare Wellness Visit. I appreciate your ongoing commitment to your health goals. Please review the following plan we discussed and let me know if I can assist you in the future.   These are the goals we discussed:  Goals   None     This is a list of the screening recommended for you and due dates:  Health Maintenance  Topic Date Due   Hepatitis C Screening: USPSTF Recommendation to screen - Ages 36-79 yo.  Never done   Mammogram  Never done   COVID-19 Vaccine (1) 01/01/2023*   Flu Shot  01/17/2023*   Pneumonia Vaccine (1 of 1 - PCV) 12/01/2023*   Zoster (Shingles) Vaccine (1 of 2) 12/01/2023*   DEXA scan (bone density measurement)  12/17/2023*   Colon Cancer Screening  12/17/2023*   Medicare Annual Wellness Visit  12/17/2023   HPV Vaccine  Aged Out   DTaP/Tdap/Td vaccine  Discontinued  *Topic was postponed. The date shown is not the original due date.   Health Maintenance After Age 37 After age 37, you are at a higher risk for certain long-term diseases and infections as well as injuries from falls. Falls are a major cause of broken bones and head injuries in people who are older than age 73. Getting regular preventive care can help to keep you healthy and well. Preventive care includes getting regular testing and making lifestyle changes as recommended by your health care provider. Talk with your health care provider about: Which screenings and tests you should have. A screening is a test that checks for a disease when you have no symptoms. A diet and exercise plan that is right for you. What should I know about screenings and tests to prevent falls? Screening and testing are the best ways to find a health problem early. Early diagnosis and treatment give you the best chance of managing medical conditions that are common after age 65. Certain conditions and lifestyle choices may make you more likely to have a fall. Your health  care provider may recommend: Regular vision checks. Poor vision and conditions such as cataracts can make you more likely to have a fall. If you wear glasses, make sure to get your prescription updated if your vision changes. Medicine review. Work with your health care provider to regularly review all of the medicines you are taking, including over-the-counter medicines. Ask your health care provider about any side effects that may make you more likely to have a fall. Tell your health care provider if any medicines that you take make you feel dizzy or sleepy. Strength and balance checks. Your health care provider may recommend certain tests to check your strength and balance while standing, walking, or changing positions. Foot health exam. Foot pain and numbness, as well as not wearing proper footwear, can make you more likely to have a fall. Screenings, including: Osteoporosis screening. Osteoporosis is a condition that causes the bones to get weaker and break more easily. Blood pressure screening. Blood pressure changes and medicines to control blood pressure can make you feel dizzy. Depression screening. You may be more likely to have a fall if you have a fear of falling, feel depressed, or feel unable to do activities that you used to do. Alcohol use screening. Using too much alcohol can affect your balance and may make you more likely to have a fall. Follow these instructions at home: Lifestyle Do not drink alcohol if: Your  health care provider tells you not to drink. If you drink alcohol: Limit how much you have to: 0-1 drink a day for women. 0-2 drinks a day for men. Know how much alcohol is in your drink. In the [place] one drink equals one 12 oz bottle of beer (355 mL), one 5 oz glass of wine (148 mL), or one 1 oz glass of hard liquor (44 mL). Do not use any products that contain nicotine or tobacco. These products include cigarettes, chewing tobacco, and vaping devices, such as  e-cigarettes. If you need help quitting, ask your health care provider. Activity  Follow a regular exercise program to stay fit. This will help you maintain your balance. Ask your health care provider what types of exercise are appropriate for you. If you need a cane or walker, use it as recommended by your health care provider. Wear supportive shoes that have nonskid soles. Safety  Remove any tripping hazards, such as rugs, cords, and clutter. Install safety equipment such as grab bars in bathrooms and safety rails on stairs. Keep rooms and walkways well-lit. General instructions Talk with your health care provider about your risks for falling. Tell your health care provider if: You fall. Be sure to tell your health care provider about all falls, even ones that seem minor. You feel dizzy, tiredness (fatigue), or off-balance. Take over-the-counter and prescription medicines only as told by your health care provider. These include supplements. Eat a healthy diet and maintain a healthy weight. A healthy diet includes low-fat dairy products, low-fat (lean) meats, and fiber from whole grains, beans, and lots of fruits and vegetables. Stay current with your vaccines. Schedule regular health, dental, and eye exams. Summary Having a healthy lifestyle and getting preventive care can help to protect your health and wellness after age 16. Screening and testing are the best way to find a health problem early and help you avoid having a fall. Early diagnosis and treatment give you the best chance for managing medical conditions that are more common for people who are older than age 80. Falls are a major cause of broken bones and head injuries in people who are older than age 72. Take precautions to prevent a fall at home. Work with your health care provider to learn what changes you can make to improve your health and wellness and to prevent falls. This information is not intended to replace advice given  to you by your health care provider. Make sure you discuss any questions you have with your health care provider. Document Revised: 02/24/2021 Document Reviewed: 02/24/2021 Elsevier Patient Education  [place].

## 2022-12-16 NOTE — Progress Notes (Signed)
Subjective:   Erika Case is a 72 y.o. female who presents for Medicare Annual (Subsequent) preventive examination.  Review of Systems     I connected with Aleshia Kawecki on 12/16/2022 at 1:55 pm by telephone and verified that I am speaking with the correct person using two identifiers. I discussed the limitations, risks, security and privacy concerns of performing an evaluation and management service by telephone and the availability of in person appointments. I also discussed with the patient that there may be a patient responsible charge related to this service. The patient expressed understanding and agreed to proceed.   Patient location: Home My Location: [place] on the telephone call: Myself and Patinet     Cardiac Risk Factors include: none     Objective:    Today's Vitals   12/16/22 1356  PainSc: 5    There is no height or weight on file to calculate BMI.     12/16/2022    2:00 PM 10/22/2021   12:06 PM 10/17/2021    9:28 PM 10/19/2018   10:02 AM 04/01/2015    8:58 AM 11/01/2014    2:26 PM 10/31/2014    3:50 PM  Advanced Directives  Does Patient Have a Medical Advance Directive? No No No No No No No  Would patient like information on creating a medical advance directive? Yes (ED - Information included in AVS)    No - patient declined information      Current Medications (verified) Outpatient Encounter Medications as of 12/16/2022  Medication Sig   acetaminophen (TYLENOL) 325 MG tablet Take 325 mg by mouth at bedtime as needed (Sleep). Tylenol PM.   amLODipine (NORVASC) 5 MG tablet Take 1 tablet (5 mg total) by mouth daily.   hydrOXYzine (ATARAX) 25 MG tablet Take 1 tablet (25 mg total) by mouth at bedtime as needed.   ondansetron (ZOFRAN-ODT) 4 MG disintegrating tablet Take 1 tablet (4 mg total) by mouth every 8 (eight) hours as needed for nausea or vomiting.   predniSONE (DELTASONE) 1 MG tablet Take 2 tablets (2 mg total) by mouth daily with  breakfast. (Patient not taking: Reported on 12/16/2022)   No facility-administered encounter medications on file as of 12/16/2022.    Allergies (verified) Patient has no known allergies.   History: Past Medical History:  Diagnosis Date   Hypertension    History reviewed. No pertinent surgical history. History reviewed. No pertinent family history. Social History   Socioeconomic History   Marital status: Single    Spouse name: Not on file   Number of children: 2   Years of education: Not on file   Highest education level: Not on file  Occupational History   Occupation: [employer]  Tobacco Use   Smoking status: Never   Smokeless tobacco: Never  Vaping Use   Vaping Use: Never used  Substance and Sexual Activity   Alcohol use: No   Drug use: No   Sexual activity: Not Currently  Other Topics Concern   Not on file  Social History Narrative   Not on file   Social Determinants of Health   Financial Resource Strain: Low Risk  (12/16/2022)   Overall Financial Resource Strain (CARDIA)    Difficulty of Paying Living Expenses: Not hard at all  Food Insecurity: No Food Insecurity (12/16/2022)   Hunger Vital Sign    Worried About Running Out of Food in the Last Year: Never true    Gregory in the Last  Year: Never true  Transportation Needs: No Transportation Needs (12/16/2022)   PRAPARE - Hydrologist (Medical): No    Lack of Transportation (Non-Medical): No  Physical Activity: Inactive (12/16/2022)   Exercise Vital Sign    Days of Exercise per Week: 0 days    Minutes of Exercise per Session: 0 min  Stress: No Stress Concern Present (12/16/2022)   [place]    Feeling of Stress : Not at all  Social Connections: Socially Isolated (12/16/2022)   Social Connection and Isolation Panel [NHANES]    Frequency of Communication with Friends and Family: More than three times a week     Frequency of Social Gatherings with Friends and Family: Never    Attends Religious Services: Never    Marine scientist or Organizations: No    Attends Music therapist: Never    Marital Status: Never married    Tobacco Counseling Counseling given: Not Answered   Clinical Intake:  Pre-visit preparation completed: No  Pain : 0-10 Pain Score: 5  Pain Location: Finger (Comment which one) Pain Orientation: Right Pain Descriptors / Indicators: Aching Pain Onset: 1 to 4 weeks ago Pain Frequency: Occasional     Nutritional Risks: None Diabetes: No  How often do you need to have someone help you when you read instructions, pamphlets, or other written materials from your doctor or pharmacy?: 1 - Never   Interpreter Needed?: No    Activities of Daily Living    12/16/2022    2:00 PM  In your present state of health, do you have any difficulty performing the following activities:  Hearing? 0  Vision? 0  Difficulty concentrating or making decisions? 0  Walking or climbing stairs? 0  Dressing or bathing? 0  Doing errands, shopping? 0  Preparing Food and eating ? N  Using the Toilet? N  In the past six months, have you accidently leaked urine? N  Do you have problems with loss of bowel control? N  Managing your Medications? N  Managing your Finances? N  Housekeeping or managing your Housekeeping? N    Patient Care Team: Ladell Pier, MD as PCP - General (Internal Medicine)  Indicate any recent Medical Services you may have received from other than [facility] providers in the past year (date may be approximate).     Assessment:   This is a routine wellness examination for Erika Case.  Hearing/Vision screen No results found.  Dietary issues and exercise activities discussed: Current Exercise Habits: The patient does not participate in regular exercise at present, Exercise limited by: None identified   Goals Addressed   None    Depression Screen     12/16/2022    2:00 PM 11/30/2022    9:19 AM 05/28/2022    9:12 AM 08/29/2021   10:35 AM  PHQ 2/9 Scores  PHQ - 2 Score 0 3 0 0  PHQ- 9 Score 0 5  9    Fall Risk    12/16/2022    2:00 PM 05/28/2022    9:11 AM 08/29/2021    9:29 AM  Fall Risk   Falls in the past year? 0 0 0  Number falls in past yr: 0 0 0  Injury with Fall? 0 0 0  Risk for fall due to : No Fall Risks No Fall Risks No Fall Risks  Follow up Falls prevention discussed      FALL RISK PREVENTION  PERTAINING TO THE HOME:  Any stairs in or around the home? No  If so, are there any without handrails? No  Home free of loose throw rugs in walkways, pet beds, electrical cords, etc? Yes  Adequate lighting in your home to reduce risk of falls? Yes   ASSISTIVE DEVICES UTILIZED TO PREVENT FALLS:  Life alert? No  Use of a cane, walker or w/c? No  Grab bars in the bathroom? Yes  Shower chair or bench in shower? No  Elevated toilet seat or a handicapped toilet? No   TIMED UP AND GO:  Was the test performed? No .  Length of time to ambulate 10 feet: N/A sec.   Gait slow and steady without use of assistive device  Cognitive Function:    12/16/2022    2:01 PM  MMSE - Mini Mental State Exam  Not completed: Unable to complete        12/16/2022    2:01 PM  6CIT Screen  What Year? 0 points  What month? 0 points  What time? 0 points  Count back from 20 4 points  Months in reverse 4 points  Repeat phrase 0 points  Total Score 8 points    Immunizations  There is no immunization history on file for this patient.  TDAP status: Due, Education has been provided regarding the importance of this vaccine. Advised may receive this vaccine at local pharmacy or Health Dept. Aware to provide a copy of the vaccination record if obtained from local pharmacy or Health Dept. Verbalized acceptance and understanding.  Flu Vaccine status: Declined, Education has been provided regarding the importance of this vaccine but patient  still declined. Advised may receive this vaccine at local pharmacy or Health Dept. Aware to provide a copy of the vaccination record if obtained from local pharmacy or Health Dept. Verbalized acceptance and understanding.  Pneumococcal vaccine status: Declined,  Education has been provided regarding the importance of this vaccine but patient still declined. Advised may receive this vaccine at local pharmacy or Health Dept. Aware to provide a copy of the vaccination record if obtained from local pharmacy or Health Dept. Verbalized acceptance and understanding.   Covid-19 vaccine status: Declined, Education has been provided regarding the importance of this vaccine but patient still declined. Advised may receive this vaccine at local pharmacy or Health Dept.or vaccine clinic. Aware to provide a copy of the vaccination record if obtained from local pharmacy or Health Dept. Verbalized acceptance and understanding.  Qualifies for Shingles Vaccine? Yes   Zostavax completed  Patient Declined   Shingrix Completed?: No.    Education has been provided regarding the importance of this vaccine. Patient has been advised to call insurance company to determine out of pocket expense if they have not yet received this vaccine. Advised may also receive vaccine at local pharmacy or Health Dept. Verbalized acceptance and understanding.  Screening Tests Health Maintenance  Topic Date Due   Medicare Annual Wellness (AWV)  Never done   COVID-19 Vaccine (1) Never done   Hepatitis C Screening  Never done   DTaP/Tdap/Td (1 - Tdap) Never done   COLONOSCOPY (Pts 45-57yr Insurance coverage will need to be confirmed)  Never done   MAMMOGRAM  Never done   DEXA SCAN  Never done   INFLUENZA VACCINE  01/17/2023 (Originally 05/19/2022)   Pneumonia Vaccine 72 Years old (1 of 1 - PCV) 12/01/2023 (Originally 04/23/2016)   Zoster Vaccines- Shingrix (1 of 2) 12/01/2023 (Originally 04/23/2001)   HPV  VACCINES  Aged Out    Health  Maintenance  Health Maintenance Due  Topic Date Due   Medicare Annual Wellness (AWV)  Never done   COVID-19 Vaccine (1) Never done   Hepatitis C Screening  Never done   DTaP/Tdap/Td (1 - Tdap) Never done   COLONOSCOPY (Pts 45-49yr Insurance coverage will need to be confirmed)  Never done   MAMMOGRAM  Never done   DEXA SCAN  Never done    Colorectal cancer screening: No longer required.  Patient Declined  Mammogram status: Ordered 11/30/2022. Pt provided with contact info and advised to call to schedule appt.  Patient Declined to get her mammogram done.  Bone Density: Patient Declined.  Lung Cancer Screening: (Low Dose CT Chest recommended if Age 72-80years, 30 pack-year currently smoking OR have quit w/in 15years.) does not qualify.   Lung Cancer Screening Referral: N/A  Additional Screening:  Hepatitis C Screening: does qualify; Completed NO  Vision Screening: Recommended annual ophthalmology exams for early detection of glaucoma and other disorders of the eye. Is the patient up to date with their annual eye exam?  Yes  Who is the provider or what is the name of the office in which the patient attends annual eye exams? N/A If pt is not established with a provider, would they like to be referred to a provider to establish care? No .   Dental Screening: Recommended annual dental exams for proper oral hygiene  Community Resource Referral / Chronic Care Management: CRR required this visit?  No   CCM required this visit?  No      Plan:     I have personally reviewed and noted the following in the patient's chart:   Medical and social history Use of alcohol, tobacco or illicit drugs  Current medications and supplements including opioid prescriptions. Patient is not currently taking opioid prescriptions. Functional ability and status Nutritional status Physical activity Advanced directives List of other physicians Hospitalizations, surgeries, and ER visits in previous  12 months Vitals Screenings to include cognitive, depression, and falls Referrals and appointments  In addition, I have reviewed and discussed with patient certain preventive protocols, quality metrics, and best practice recommendations. A written personalized care plan for preventive services as well as general preventive health recommendations were provided to patient.     AGomez Cleverly [place]  12/16/2022   Nurse Notes: I spent 25 minutes on this telephone encounter AVS mailed to patinet

## 2023-02-01 ENCOUNTER — Other Ambulatory Visit: Payer: Self-pay | Admitting: Internal Medicine

## 2023-02-02 ENCOUNTER — Telehealth: Payer: Self-pay | Admitting: Internal Medicine

## 2023-02-02 DIAGNOSIS — M05741 Rheumatoid arthritis with rheumatoid factor of right hand without organ or systems involvement: Secondary | ICD-10-CM

## 2023-02-02 NOTE — Telephone Encounter (Signed)
I have resubmitted referral to rheumatology.

## 2023-02-02 NOTE — Telephone Encounter (Signed)
Called & spoke to the patient. Verified name & DOB. Informed of message below and that a 1 month supply of prednisone has been sent to the pharmacy. Patient expressed verbal understanding of all discussed and confirmed that she will be scheduling an appointment with the specialist and will pick up medication from the pharmacy. No further questions at this time.

## 2023-02-02 NOTE — Telephone Encounter (Signed)
Requested medication (s) are due for refill today - yes  Requested medication (s) are on the active medication list -yes  Future visit scheduled -yes  Last refill: 12/03/22 #60 1RF  Notes to clinic: non delegated Rx  Requested Prescriptions  Pending Prescriptions Disp Refills   predniSONE (DELTASONE) 1 MG tablet [Pharmacy Med Name: predniSONE 1 MG Oral Tablet] 60 tablet 0    Sig: TAKE 2 TABLETS BY MOUTH ONCE DAILY WITH BREAKFAST     Not Delegated - Endocrinology:  Oral Corticosteroids Failed - 02/01/2023  8:16 AM      Failed - This refill cannot be delegated      Failed - Manual Review: Eye exam for IOP if prolonged treatment      Failed - Bone Mineral Density or Dexa Scan completed in the last 2 years      Passed - Glucose (serum) in normal range and within 180 days    Glucose  Date Value Ref Range Status  11/30/2022 94 70 - 99 mg/dL Final   Glucose, Bld  Date Value Ref Range Status  10/22/2021 87 70 - 99 mg/dL Final    Comment:    Glucose reference range applies only to samples taken after fasting for at least 8 hours.   Glucose-Capillary  Date Value Ref Range Status  10/22/2021 78 70 - 99 mg/dL Final    Comment:    Glucose reference range applies only to samples taken after fasting for at least 8 hours.         Passed - K in normal range and within 180 days    Potassium  Date Value Ref Range Status  11/30/2022 4.0 3.5 - 5.2 mmol/L Final         Passed - Na in normal range and within 180 days    Sodium  Date Value Ref Range Status  11/30/2022 140 134 - 144 mmol/L Final         Passed - Last BP in normal range    BP Readings from Last 1 Encounters:  11/30/22 113/77         Passed - Valid encounter within last 6 months    Recent Outpatient Visits           2 months ago Essential hypertension   [place] [facility] Marcine Matar, MD   8 months ago Anxiety about health   [facility],  Marzella Schlein, [place]   1 year ago Essential hypertension   [place] [facility] Marcine Matar, MD   1 year ago Dizziness   [facility] [place], Kasandra [place]ud[place], [place]       Future Appointments             In 1 month Laural Benes, Binnie Rail, MD [facility]               Requested Prescriptions  Pending Prescriptions Disp Refills   predniSONE (DELTASONE) 1 MG tablet [Pharmacy Med Name: predniSONE 1 MG Oral Tablet] 60 tablet 0    Sig: TAKE 2 TABLETS BY MOUTH ONCE DAILY WITH BREAKFAST     Not Delegated - Endocrinology:  Oral Corticosteroids Failed - 02/01/2023  8:16 AM      Failed - This refill cannot be delegated      Failed - Manual Review: Eye exam for IOP if prolonged treatment  Failed - Bone Mineral Density or Dexa Scan completed in the last 2 years      Passed - Glucose (serum) in normal range and within 180 days    Glucose  Date Value Ref Range Status  11/30/2022 94 70 - 99 mg/dL Final   Glucose, Bld  Date Value Ref Range Status  10/22/2021 87 70 - 99 mg/dL Final    Comment:    Glucose reference range applies only to samples taken after fasting for at least 8 hours.   Glucose-Capillary  Date Value Ref Range Status  10/22/2021 78 70 - 99 mg/dL Final    Comment:    Glucose reference range applies only to samples taken after fasting for at least 8 hours.         Passed - K in normal range and within 180 days    Potassium  Date Value Ref Range Status  11/30/2022 4.0 3.5 - 5.2 mmol/L Final         Passed - Na in normal range and within 180 days    Sodium  Date Value Ref Range Status  11/30/2022 140 134 - 144 mmol/L Final         Passed - Last BP in normal range    BP Readings from Last 1 Encounters:  11/30/22 113/77         Passed - Valid encounter within last 6 months    Recent Outpatient Visits           2 months ago Essential hypertension   [place] [facility] Marcine Matar, MD   8 months ago Anxiety about health   [facility] [place], Marzella Schlein, [place]   1 year ago Essential hypertension   [place] [facility] Marcine Matar, MD   1 year ago Dizziness   [facility] [place], [place], [place]       Future Appointments             In 1 month Laural Benes, Binnie Rail, MD [facility]

## 2023-03-31 ENCOUNTER — Ambulatory Visit: Payer: Medicaid Other | Admitting: Internal Medicine

## 2023-04-01 ENCOUNTER — Ambulatory Visit: Payer: Self-pay | Admitting: Internal Medicine

## 2023-05-04 ENCOUNTER — Telehealth: Payer: Self-pay | Admitting: Internal Medicine

## 2023-05-04 NOTE — Telephone Encounter (Addendum)
Referral Request - Has patient seen PCP for this complaint? Yes.   *If NO, is insurance requiring patient see PCP for this issue before PCP can refer them? Referral for which specialty: rheumatory Preferred provider/office: any, but needs to be closer to the bus route and closer to her home. She says [place] is too far out.. she has to ride the bus. Reason for referral: pain

## 2023-05-10 NOTE — Telephone Encounter (Signed)
Spoke with patient . Verified name & DOB    Advise patient that she has an appointment with  Dr Dimple Casey  8/22/204 @ 8:40am  is 5 min from the clinic  and close to her address too . [facility] [address] [place], [place] [address] [phone]   Patient plans to call office as she voiced she did not know she had this appointment.

## 2023-05-31 ENCOUNTER — Ambulatory Visit: Payer: Self-pay

## 2023-05-31 DIAGNOSIS — G629 Polyneuropathy, unspecified: Secondary | ICD-10-CM

## 2023-05-31 NOTE — Patient Instructions (Signed)
Visit Information  Thank you for taking time to visit with me today. Please don't hesitate to contact me if I can be of assistance to you.   Following are the goals we discussed today:  - Engage with [employer] Guide team to determine health plan benefits  Our next appointment is by telephone on 8/26 at 2:45  Please call the care guide team at [phone] if you need to cancel or reschedule your appointment.   If you are experiencing a Mental Health or Behavioral Health Crisis or need someone to talk to, please call 1-800-273-TALK (toll free, 24 hour hotline) go to [facility] [address], [place] [phone]) call 911  The patient verbalized understanding of instructions, educational materials, and care plan provided today and DECLINED offer to receive copy of patient instructions, educational materials, and care plan.   Bevelyn Ngo, BSW, CDP Social Worker, Certified Dementia Practitioner [facility] Care Management  Care Coordination [phone]

## 2023-05-31 NOTE — Patient Outreach (Signed)
  Care Coordination   Initial Visit Note   05/31/2023 Name: Erika Case MRN: [id number] DOB: 1951-09-29  Erika Case is a 72 y.o. year old female who sees Marcine Matar, MD for primary care. I spoke with  Erika Case by phone today.  What matters to the patients health and wellness today?  Identify transportation resource options for medical appointments.    Goals Addressed             This Visit's Progress    Care Coordination Activities       Care Coordination Interventions: SDoH screening completed - identified challenges with transportation to medical appointments Discussed the patient needs to be seen by Rheumatology but has canceled her appointment due to barriers with transportation. The patient normally rides the [facility] bus but reports she is unable to ambulate from the bus stop to her appointment due to pain Performed chart review to note patient has both Medicare and Medicaid - patient reports she has [facility] Medicare and is unsure the type of Medicaid Advised the patient she may have access to a transportation benefit - patient is agreeable to a referral to the community resource care guide team for assistance with navigating health plan benefit Discussed plan for SW to follow up with the patient over the next two weeks to assist with alternative resource options such as [employer] if needed Provided patient with education on the role of RN Care Manager within the care coordination team - patient declines scheduling a visit with RN Care Manager at this time         SDOH assessments and interventions completed:  Yes  SDOH Interventions Today    Flowsheet Row Most Recent Value  SDOH Interventions   Food Insecurity Interventions Intervention Not Indicated  Housing Interventions Intervention Not Indicated  Transportation Interventions AMB Referral        Care Coordination Interventions:  Yes, provided   Interventions Today    Flowsheet Row Most Recent Value   Chronic Disease   Chronic disease during today's visit Other  [Neurpathy,  pain,  Transportation Barriers]  General Interventions   General Interventions Discussed/Reviewed General Interventions Discussed, Doctor Visits  [Discussed the patient has canceled her appointment with Rheumatology due to transportation barriers]  Doctor Visits Discussed/Reviewed Doctor Visits Reviewed  Education Interventions   Education Provided Provided Education  Provided Verbal Education On Insurance Plans, [employer]  [Education provided on the possibility of patients health plan offering transportation,  referral to care guide team to determine if patient has benefits]        Follow up plan: Follow up call scheduled for 8/26    Encounter Outcome:  Pt. Visit Completed   Bevelyn Ngo, Kenard Gower, CDP Social Worker, Certified Dementia Practitioner [facility] Care Management  Care Coordination [phone]

## 2023-06-03 ENCOUNTER — Telehealth: Payer: Self-pay | Admitting: *Deleted

## 2023-06-03 NOTE — Telephone Encounter (Signed)
   Telephone encounter was:  Unsuccessful.  06/03/2023 Name: Esthefani Bastien MRN: [id number] DOB: 03/08/1951  Unsuccessful outbound call made today to assist with:  Transportation Needs   Outreach Attempt:  1st Attempt  A HIPAA compliant voice message was left requesting a return call.  Instructed patient to call back at [phone].  Yehuda Mao Greenauer -[facility] [place], Population Health [phone] [address] [place] , [place] [address] Email : [email or web address]

## 2023-06-04 ENCOUNTER — Telehealth: Payer: Self-pay | Admitting: *Deleted

## 2023-06-04 NOTE — Telephone Encounter (Signed)
   Telephone encounter was:  Successful.  06/04/2023 Name: Elizabethrose Johansen MRN: [id number] DOB: [birth year]  Jabreia Delacruz is a 72 y.o. year old female who is a primary care patient of Marcine Matar, MD . The community resource team was consulted for assistance with Transportation Needs   Care guide performed : Follow up call placed to the patient to discuss status of referral. patient heading out to job asked I call back monday am to conference her into [facility] to set up her transportation advised patient i would also mail her an application for [place] access  Follow Up Plan:  Care guide will follow up with patient by phone over the next day  Haydn Cush Greenauer -[facility], Population Health [phone] [address] [place] , [place] [address] Email : [email or web address]

## 2023-06-07 ENCOUNTER — Telehealth: Payer: Self-pay | Admitting: *Deleted

## 2023-06-08 NOTE — Telephone Encounter (Signed)
   Telephone encounter was:  Successful.  06/08/2023 Name: Erika Case MRN: [id number] DOB: [birth year]  Erika Case is a 72 y.o. year old female who is a primary care patient of Marcine Matar, MD . The community resource team was consulted for assistance with Transportation Needs  Called [facility] with patient online to establish her [facility] transportation connection she has [employer] transport [phone] and has 48 one way rides [address] and Pharmacy , Must be within 50 miles and needs 3 days notice  gave patient my number if she needs further assistance  Care guide performed the following interventions: Patient provided with information about care guide support team and interviewed to confirm resource needs.  Follow Up Plan:  No further follow up planned at this time. The patient has been provided with needed resources.  Yehuda Mao Greenauer -[facility], Population Health [phone] [address] [place] , [place] [address] Email : [email or web address]

## 2023-06-10 ENCOUNTER — Encounter: Payer: Medicaid Other | Admitting: Internal Medicine

## 2023-06-14 ENCOUNTER — Ambulatory Visit: Payer: Self-pay

## 2023-06-14 NOTE — Patient Instructions (Signed)
Visit Information  Thank you for taking time to visit with me today. Please don't hesitate to contact me if I can be of assistance to you.   Following are the goals we discussed today:  - Arrange transportation to your provider appointment with your health plan benefit - Contact your primary care provider as needed   If you are experiencing a Mental Health or Behavioral Health Crisis or need someone to talk to, please call 1-800-273-TALK (toll free, 24 hour hotline) go to [facility] [address] [place] [phone]) call 911  The patient verbalized understanding of instructions, educational materials, and care plan provided today and DECLINED offer to receive copy of patient instructions, educational materials, and care plan.   No further follow up required: Please contact me as needed.  Bevelyn Ngo, BSW, CDP Social Worker, Certified Dementia Practitioner [facility] Care Management  Care Coordination [phone]

## 2023-06-14 NOTE — Patient Outreach (Signed)
  Care Coordination   Follow Up Visit Note   06/14/2023 Name: Nickayla Albiter MRN: [id number] DOB: 08-15-1951  Faeryn Mundine is a 72 y.o. year old female who sees Marcine Matar, MD for primary care. I spoke with  Yanisa Sheek by phone today.  What matters to the patients health and wellness today?  The patient is working on rescheduling Rheumatology appointment.    Goals Addressed             This Visit's Progress    COMPLETED: Care Coordination Activities       Care Coordination Interventions: Determined the patient has been in contact with the [facility] Guide team who assisted the patient with understanding health plan transportation benefits Assessed for ongoing care coordination needs - none identified at this time Encouraged the patient to contact SW as needed         SDOH assessments and interventions completed:  No     Care Coordination Interventions:  Yes, provided   Interventions Today    Flowsheet Row Most Recent Value  Chronic Disease   Chronic disease during today's visit Other  [Neuropathy,  transportation barriers]  General Interventions   General Interventions Discussed/Reviewed General Interventions Reviewed  [Discussed patient will use health plan transportation for medical appointments]        Follow up plan: No further intervention required.   Encounter Outcome:  Pt. Visit Completed   Bevelyn Ngo, BSW, CDP Social Worker, Certified Dementia Practitioner [facility] Care Management  Care Coordination [phone]

## 2023-06-28 ENCOUNTER — Other Ambulatory Visit: Payer: Self-pay | Admitting: Internal Medicine

## 2023-06-28 DIAGNOSIS — G4700 Insomnia, unspecified: Secondary | ICD-10-CM

## 2023-06-28 DIAGNOSIS — F411 Generalized anxiety disorder: Secondary | ICD-10-CM

## 2023-06-28 DIAGNOSIS — I10 Essential (primary) hypertension: Secondary | ICD-10-CM

## 2023-06-28 NOTE — Telephone Encounter (Signed)
Medication Refill - Medication:  amLODipine (NORVASC) 5 MG tablet   hydrOXYzine (ATARAX) 25 MG tablet   Has the patient contacted their pharmacy? No. Pt had appt on Thurs, and had to reschedule for Mon, 11/08  Preferred Pharmacy (with phone number or street name): [id number] - [place] ([place]), [place] - [address]  Has the patient been seen for an appointment in the last year OR does the patient have an upcoming appointment? Yes.    Agent: Please be advised that RX refills may take up to 3 business days. We ask that you follow-up with your pharmacy.

## 2023-06-29 ENCOUNTER — Ambulatory Visit: Payer: Medicaid Other | Admitting: Internal Medicine

## 2023-06-29 MED ORDER — AMLODIPINE BESYLATE 5 MG PO TABS: 5.0000 mg | ORAL_TABLET | Freq: Every day | ORAL | 0 refills | Status: DC

## 2023-06-29 MED ORDER — HYDROXYZINE HCL 25 MG PO TABS: 25.0000 mg | ORAL_TABLET | Freq: Every evening | ORAL | 0 refills | Status: DC | PRN

## 2023-07-01 ENCOUNTER — Ambulatory Visit: Payer: 59 | Admitting: Internal Medicine

## 2023-08-30 ENCOUNTER — Encounter: Payer: Self-pay | Admitting: Internal Medicine

## 2023-08-30 ENCOUNTER — Ambulatory Visit: Payer: 59 | Attending: Internal Medicine | Admitting: Internal Medicine

## 2023-08-30 VITALS — BP 130/80 | HR 76 | Temp 98.1°F | Ht 66.0 in | Wt 158.0 lb

## 2023-08-30 DIAGNOSIS — F411 Generalized anxiety disorder: Secondary | ICD-10-CM

## 2023-08-30 DIAGNOSIS — Z1159 Encounter for screening for other viral diseases: Secondary | ICD-10-CM

## 2023-08-30 DIAGNOSIS — I10 Essential (primary) hypertension: Secondary | ICD-10-CM

## 2023-08-30 DIAGNOSIS — G4700 Insomnia, unspecified: Secondary | ICD-10-CM

## 2023-08-30 DIAGNOSIS — M05741 Rheumatoid arthritis with rheumatoid factor of right hand without organ or systems involvement: Secondary | ICD-10-CM | POA: Diagnosis not present

## 2023-08-30 DIAGNOSIS — M05742 Rheumatoid arthritis with rheumatoid factor of left hand without organ or systems involvement: Secondary | ICD-10-CM | POA: Diagnosis not present

## 2023-08-30 DIAGNOSIS — N1831 Chronic kidney disease, stage 3a: Secondary | ICD-10-CM

## 2023-08-30 MED ORDER — AMLODIPINE BESYLATE 5 MG PO TABS: 5.0000 mg | ORAL_TABLET | Freq: Every day | ORAL | 1 refills | Status: DC

## 2023-08-30 MED ORDER — HYDROXYZINE HCL 25 MG PO TABS: 25.0000 mg | ORAL_TABLET | Freq: Every evening | ORAL | 1 refills | Status: DC | PRN

## 2023-08-30 NOTE — Progress Notes (Signed)
Patient ID: Erika Case, female    DOB: December 01, 1950  MRN: [id number]  CC: Hypertension (HTN f/u. Med refill. /Arthritis pain - requesting rx. Pt reports new insurance that may cover previous med she was taking /No to flu vax)   Subjective: Erika Case is a 72 y.o. female who presents for chronic ds management. Her concerns today include:  Patient with history of HTN, RA, high-frequency sensory neuronal hearing loss, insomnia, anxiety   RA: last seen 11/2022.  Complained of polyarthritis in the hands.  Rheumatoid factor and anti-CCP were positive.  Referred to rheumatologist; pt cancelled appt due to transportation issues.  How has [employer] which provides medical transportation. Feels hands getting worse, taking ASA OTC which has not helped.  Works at [employer].    HTN:  compliant with Norvasc 10 mg daily.  No device to check BP.  Limit salt in foods.  Last GFR was 56.  Not taking any OTC NSAIDs.  Request refill on hydroxyzine for anxiety and insomnia.  HM:  declines flu and RSV vaccines. Yes to hep C screening.   Patient Active Problem List   Diagnosis Date Noted   CKD (chronic kidney disease) stage 3, GFR 30-59 ml/min (HCC) 12/02/2022   Rheumatoid arthritis involving both hands (HCC) 12/02/2022   Dizziness 07/31/2021   Elevated blood pressure reading in office without diagnosis of hypertension 07/31/2021   Tinnitus of left ear 07/31/2021   Neuropathy 07/31/2021   Insomnia 07/31/2021   Anxiety about health 07/31/2021   Acute pyelonephritis 02/01/2021     Current Outpatient Medications on File Prior to Visit  Medication Sig Dispense Refill   acetaminophen (TYLENOL) 325 MG tablet Take 325 mg by mouth at bedtime as needed (Sleep). Tylenol PM.     ondansetron (ZOFRAN-ODT) 4 MG disintegrating tablet Take 1 tablet (4 mg total) by mouth every 8 (eight) hours as needed for nausea or vomiting. (Patient not taking: Reported on 08/30/2023) 10 tablet 0   predniSONE  (DELTASONE) 1 MG tablet TAKE 2 TABLETS BY MOUTH ONCE DAILY WITH BREAKFAST (Patient not taking: Reported on 08/30/2023) 60 tablet 0   No current facility-administered medications on file prior to visit.    No Known Allergies  Social History   Socioeconomic History   Marital status: Single    Spouse name: Not on file   Number of children: 2   Years of education: Not on file   Highest education level: Not on file  Occupational History   Occupation: [employer]  Tobacco Use   Smoking status: Never   Smokeless tobacco: Never  Vaping Use   Vaping status: Never Used  Substance and Sexual Activity   Alcohol use: No   Drug use: No   Sexual activity: Not Currently  Other Topics Concern   Not on file  Social History Narrative   Not on file   Social Determinants of Health   Financial Resource Strain: Low Risk  (08/30/2023)   Overall Financial Resource Strain (CARDIA)    Difficulty of Paying Living Expenses: Not very hard  Food Insecurity: No Food Insecurity (08/30/2023)   Hunger Vital Sign    Worried About Running Out of Food in the Last Year: Never true    Ran Out of Food in the Last Year: Never true  Transportation Needs: No Transportation Needs (08/30/2023)   PRAPARE - Administrator, Civil Service (Medical): No    Lack of Transportation (Non-Medical): No  Physical Activity: Insufficiently Active (08/30/2023)   Exercise  Vital Sign    Days of Exercise per Week: 5 days    Minutes of Exercise per Session: 20 min  Stress: No Stress Concern Present (08/30/2023)   [employer] of Occupational Health - Occupational Stress Questionnaire    Feeling of Stress : Not at all  Social Connections: Socially Isolated (08/30/2023)   Social Connection and Isolation Panel [NHANES]    Frequency of Communication with Friends and Family: Twice a week    Frequency of Social Gatherings with Friends and Family: Once a week    Attends Religious Services: Never    Doctor, general practice or Organizations: No    Attends Banker Meetings: Never    Marital Status: Never married  Intimate Partner Violence: Not At Risk (08/30/2023)   Humiliation, Afraid, Rape, and Kick questionnaire    Fear of Current or Ex-Partner: No    Emotionally Abused: No    Physically Abused: No    Sexually Abused: No    No family history on file.  No past surgical history on file.  ROS: Review of Systems Negative except as stated above  PHYSICAL EXAM: BP 130/80   Pulse 76   Temp 98.1 F (36.7 C) (Oral)   Ht 5\' 6"  (1.676 m)   Wt 158 lb (71.7 kg)   SpO2 99%   BMI 25.50 kg/m   Physical Exam  General appearance - alert, well appearing, and in no distress Mental status - normal mood, behavior, speech, dress, motor activity, and thought processes Chest - clear to auscultation, no wheezes, rales or rhonchi, symmetric air entry Heart - normal rate, regular rhythm, normal S1, S2, no murmurs, rubs, clicks or gallops Musculoskeletal -she has mild enlargement of the MCP joints of the right hand and PIP joints of both hands.      Latest Ref Rng & Units 11/30/2022   10:12 AM 05/28/2022    9:35 AM 10/23/2021   12:30 AM  CMP  Glucose 70 - 99 mg/dL 94  87    BUN 8 - 27 mg/dL 9  8    Creatinine 4.13 - 1.00 mg/dL 2.44  0.10    Sodium 272 - 144 mmol/L 140  142    Potassium 3.5 - 5.2 mmol/L 4.0  3.5    Chloride 96 - 106 mmol/L 101  105    CO2 20 - 29 mmol/L 23  22    Calcium 8.7 - 10.3 mg/dL 9.7  9.5    Total Protein 6.0 - 8.5 g/dL 7.6  7.3  7.4   Total Bilirubin 0.0 - 1.2 mg/dL 0.3  0.3  0.6   Alkaline Phos 44 - 121 IU/L 100  80  71   AST 0 - 40 IU/L 19  19  16    ALT 0 - 32 IU/L 12  14  21     Lipid Panel     Component Value Date/Time   CHOL 183 11/30/2022 1012   TRIG 71 11/30/2022 1012   HDL 90 11/30/2022 1012   CHOLHDL 2.0 11/30/2022 1012   LDLCALC 80 11/30/2022 1012    CBC    Component Value Date/Time   WBC 3.6 11/30/2022 1012   WBC 5.1 10/22/2021 2151   RBC  4.75 11/30/2022 1012   RBC 5.01 10/22/2021 2151   HGB 12.9 11/30/2022 1012   HCT 39.7 11/30/2022 1012   PLT 249 11/30/2022 1012   MCV 84 11/30/2022 1012   MCH 27.2 11/30/2022 1012   MCH 27.9 10/22/2021 2151  MCHC 32.5 11/30/2022 1012   MCHC 33.4 10/22/2021 2151   RDW 12.6 11/30/2022 1012   LYMPHSABS 1.4 10/17/2021 2220   MONOABS 0.2 10/17/2021 2220   EOSABS 0.0 10/17/2021 2220   BASOSABS 0.0 10/17/2021 2220    ASSESSMENT AND PLAN: 1. Essential hypertension At goal.  Continue Norvasc. - amLODipine (NORVASC) 5 MG tablet; Take 1 tablet (5 mg total) by mouth daily.  Dispense: 90 tablet; Refill: 1 - Comprehensive metabolic panel - CBC  2. Rheumatoid arthritis involving both hands with positive rheumatoid factor (HCC) Advised patient that it is very important that she gets in with the rheumatologist to be started on an effective DMARD.  I had placed her on low-dose prednisone this past spring for short period.  Advised that prednisone is not a good medication to use long-term because of potential side effects including development of diabetes, weight loss, osteoporosis.  Will check chemistry today to see whether we can put her on Celebrex, if not we will put her back on a low-dose of prednisone until she gets in with the rheumatologist - Ambulatory referral to Rheumatology  3. Anxiety state - hydrOXYzine (ATARAX) 25 MG tablet; Take 1 tablet (25 mg total) by mouth at bedtime as needed.  Dispense: 90 tablet; Refill: 1  4. Insomnia, unspecified type - hydrOXYzine (ATARAX) 25 MG tablet; Take 1 tablet (25 mg total) by mouth at bedtime as needed.  Dispense: 90 tablet; Refill: 1  5. CKD 3a Recheck renal function today with chemistry  6. Need for hepatitis C screening test Patient agreeable to screening. - Hepatitis C Antibody   Patient was given the opportunity to ask questions.  Patient verbalized understanding of the plan and was [place] to repeat key elements of the plan.   This  documentation was completed using Paediatric nurse.  Any transcriptional errors are unintentional.  Orders Placed This Encounter  Procedures   Comprehensive metabolic panel   CBC   Hepatitis C Antibody   Ambulatory referral to Rheumatology     Requested Prescriptions   Signed Prescriptions Disp Refills   amLODipine (NORVASC) 5 MG tablet 90 tablet 1    Sig: Take 1 tablet (5 mg total) by mouth daily.   hydrOXYzine (ATARAX) 25 MG tablet 90 tablet 1    Sig: Take 1 tablet (25 mg total) by mouth at bedtime as needed.    Return in about 2 months (around 10/30/2023).  Jonah Blue, MD, FACP

## 2023-08-31 ENCOUNTER — Other Ambulatory Visit: Payer: Self-pay | Admitting: Internal Medicine

## 2023-08-31 LAB — HEPATITIS C ANTIBODY: Hep C Virus Ab: NONREACTIVE

## 2023-08-31 LAB — COMPREHENSIVE METABOLIC PANEL
ALT: 12 [IU]/L (ref 0–32)
AST: 20 [IU]/L (ref 0–40)
Albumin: 4.4 g/dL (ref 3.8–4.8)
Alkaline Phosphatase: 105 [IU]/L (ref 44–121)
BUN/Creatinine Ratio: 10 — ABNORMAL LOW (ref 12–28)
BUN: 10 mg/dL (ref 8–27)
Bilirubin Total: 0.3 mg/dL (ref 0.0–1.2)
CO2: 19 mmol/L — ABNORMAL LOW (ref 20–29)
Calcium: 9.7 mg/dL (ref 8.7–10.3)
Chloride: 104 mmol/L (ref 96–106)
Creatinine, Ser: 1.04 mg/dL — ABNORMAL HIGH (ref 0.57–1.00)
Globulin, Total: 3.1 g/dL (ref 1.5–4.5)
Glucose: 92 mg/dL (ref 70–99)
Potassium: 4 mmol/L (ref 3.5–5.2)
Sodium: 142 mmol/L (ref 134–144)
Total Protein: 7.5 g/dL (ref 6.0–8.5)
eGFR: 57 mL/min/{1.73_m2} — ABNORMAL LOW (ref >59)

## 2023-08-31 LAB — CBC
Hematocrit: 40.3 % (ref 34.0–46.6)
Hemoglobin: 13.1 g/dL (ref 11.1–15.9)
MCH: 27.6 pg (ref 26.6–33.0)
MCHC: 32.5 g/dL (ref 31.5–35.7)
MCV: 85 fL (ref 79–97)
Platelets: 252 10*3/uL (ref 150–450)
RBC: 4.75 x10E6/uL (ref 3.77–5.28)
RDW: 13.2 % (ref 11.7–15.4)
WBC: 4.4 10*3/uL (ref 3.4–10.8)

## 2023-08-31 MED ORDER — PREDNISONE 2.5 MG PO TABS: 2.5000 mg | ORAL_TABLET | Freq: Every day | ORAL | 0 refills | Status: DC

## 2023-11-01 ENCOUNTER — Ambulatory Visit: Payer: 59 | Admitting: Internal Medicine

## 2023-12-24 ENCOUNTER — Telehealth: Payer: Self-pay | Admitting: Internal Medicine

## 2023-12-24 NOTE — Telephone Encounter (Signed)
 Contacted pt confirm appt!

## 2023-12-28 ENCOUNTER — Ambulatory Visit: Payer: 59 | Admitting: Internal Medicine

## 2024-01-03 ENCOUNTER — Ambulatory Visit: Attending: Internal Medicine | Admitting: Internal Medicine

## 2024-01-03 ENCOUNTER — Encounter: Payer: Self-pay | Admitting: Internal Medicine

## 2024-01-03 VITALS — BP 117/78 | HR 78 | Temp 98.1°F | Ht 66.0 in | Wt 153.0 lb

## 2024-01-03 DIAGNOSIS — Z5320 Procedure and treatment not carried out because of patient's decision for unspecified reasons: Secondary | ICD-10-CM

## 2024-01-03 DIAGNOSIS — M05741 Rheumatoid arthritis with rheumatoid factor of right hand without organ or systems involvement: Secondary | ICD-10-CM | POA: Diagnosis not present

## 2024-01-03 DIAGNOSIS — I120 Hypertensive chronic kidney disease with stage 5 chronic kidney disease or end stage renal disease: Secondary | ICD-10-CM

## 2024-01-03 DIAGNOSIS — R519 Headache, unspecified: Secondary | ICD-10-CM

## 2024-01-03 DIAGNOSIS — N1831 Chronic kidney disease, stage 3a: Secondary | ICD-10-CM

## 2024-01-03 DIAGNOSIS — M05742 Rheumatoid arthritis with rheumatoid factor of left hand without organ or systems involvement: Secondary | ICD-10-CM

## 2024-01-03 DIAGNOSIS — R630 Anorexia: Secondary | ICD-10-CM

## 2024-01-03 DIAGNOSIS — I10 Essential (primary) hypertension: Secondary | ICD-10-CM

## 2024-01-03 NOTE — Progress Notes (Signed)
 Patient ID: Erika Case, female    DOB: [birth year]  MRN: [id number]  CC: Follow-up (Follow-up./No questions / concerns/No to all vax)   Subjective: Erika Case is a 73 y.o. female who presents for chronic ds management. Her concerns today include:  Patient with history of HTN, RA, CKD 3a, high-frequency sensory neuronal hearing loss, insomnia, anxiety   Discussed the use of AI scribe software for clinical note transcription with the patient, who gave verbal consent to proceed.  History of Present Illness   HTN: She has been adhering to her prescribed amlodipine 5mg  daily and reports limiting her salt intake. She does not monitor her blood pressure at home but it was measured at 117/78 during this visit. She denies experiencing any chest pain or shortness of breath. Her kidney function, while not at 100%, has remained stable with GFRs in the upper 50s.  RA:  In regards to her rheumatoid arthritis, she has an upcoming appointment with a rheumatologist, Dr. Dimple Casey, on 01/21/2024.  She pans to keep the appt and will arrange medical transportation. She denies any recent flare-ups and when she does occur, they are usually in her thumb. On last visit, we had place her on limited course of Prednisone 2.5 mg daily  She has been experiencing a decrease in appetite for about a month and has been taking an over-the-counter vitamin to try to increase it. However, this has been causing her to wake up with headaches. Denies any loud snorning or being told that she snores loud. She has lost 4 pounds over the past year.   HM:  She has declined mammogram, bone density study, and colon cancer screening (all methods) at this time.     Patient Active Problem List   Diagnosis Date Noted   CKD (chronic kidney disease) stage 3, GFR 30-59 ml/min (HCC) 12/02/2022   Rheumatoid arthritis involving both hands (HCC) 12/02/2022   Dizziness 07/31/2021   Elevated blood pressure reading in office without diagnosis of  hypertension 07/31/2021   Tinnitus of left ear 07/31/2021   Neuropathy 07/31/2021   Insomnia 07/31/2021   Anxiety about health 07/31/2021   Acute pyelonephritis 02/01/2021     Current Outpatient Medications on File Prior to Visit  Medication Sig Dispense Refill   acetaminophen (TYLENOL) 325 MG tablet Take 325 mg by mouth at bedtime as needed (Sleep). Tylenol PM.     amLODipine (NORVASC) 5 MG tablet Take 1 tablet (5 mg total) by mouth daily. 90 tablet 1   hydrOXYzine (ATARAX) 25 MG tablet Take 1 tablet (25 mg total) by mouth at bedtime as needed. 90 tablet 1   predniSONE (DELTASONE) 2.5 MG tablet Take 1 tablet (2.5 mg total) by mouth daily with breakfast. 30 tablet 0   No current facility-administered medications on file prior to visit.    No Known Allergies  Social History   Socioeconomic History   Marital status: Single    Spouse name: Not on file   Number of children: 2   Years of education: Not on file   Highest education level: Not on file  Occupational History   Occupation: [employer]  Tobacco Use   Smoking status: Never   Smokeless tobacco: Never  Vaping Use   Vaping status: Never Used  Substance and Sexual Activity   Alcohol use: No   Drug use: No   Sexual activity: Not Currently  Other Topics Concern   Not on file  Social History Narrative   Not on file   Social  Drivers of Health   Financial Resource Strain: Low Risk  (08/30/2023)   Overall Financial Resource Strain (CARDIA)    Difficulty of Paying Living Expenses: Not very hard  Food Insecurity: No Food Insecurity (08/30/2023)   Hunger Vital Sign    Worried About Running Out of Food in the Last Year: Never true    Ran Out of Food in the Last Year: Never true  Transportation Needs: No Transportation Needs (08/30/2023)   PRAPARE - Administrator, Civil Service (Medical): No    Lack of Transportation (Non-Medical): No  Physical Activity: Insufficiently Active (08/30/2023)   Exercise Vital Sign     Days of Exercise per Week: 5 days    Minutes of Exercise per Session: 20 min  Stress: No Stress Concern Present (08/30/2023)   [employer] of Occupational Health - Occupational Stress Questionnaire    Feeling of Stress : Not at all  Social Connections: Socially Isolated (08/30/2023)   Social Connection and Isolation Panel [NHANES]    Frequency of Communication with Friends and Family: Twice a week    Frequency of Social Gatherings with Friends and Family: Once a week    Attends Religious Services: Never    Database administrator or Organizations: No    Attends Banker Meetings: Never    Marital Status: Never married  Intimate Partner Violence: Not At Risk (08/30/2023)   Humiliation, Afraid, Rape, and Kick questionnaire    Fear of Current or Ex-Partner: No    Emotionally Abused: No    Physically Abused: No    Sexually Abused: No    No family history on file.  No past surgical history on file.  ROS: Review of Systems Negative except as stated above  PHYSICAL EXAM: BP 117/78 (BP Location: Left Arm, Patient Position: Sitting, Cuff Size: Normal)   Pulse 78   Temp 98.1 F (36.7 C) (Oral)   Ht 5\' 6"  (1.676 m)   Wt 153 lb (69.4 kg)   SpO2 98%   BMI 24.69 kg/m   Wt Readings from Last 3 Encounters:  01/03/24 153 lb (69.4 kg)  08/30/23 158 lb (71.7 kg)  11/30/22 157 lb (71.2 kg)    Physical Exam  General appearance - alert, well appearing, older AAF and in no distress Mental status - normal mood, behavior, speech, dress, motor activity, and thought processes Neck - supple, no significant adenopathy Chest - clear to auscultation, no wheezes, rales or rhonchi, symmetric air entry Heart - normal rate, regular rhythm, normal S1, S2, no murmurs, rubs, clicks or gallops Extremities - peripheral pulses normal, no pedal edema, no clubbing or cyanosis MSK: She has mild enlargement of the DIP joints.  No signs of active inflammation noted at this time.      Latest Ref Rng & Units 08/30/2023   10:12 AM 11/30/2022   10:12 AM 05/28/2022    9:35 AM  CMP  Glucose 70 - 99 mg/dL 92  94  87   BUN 8 - 27 mg/dL 10  9  8    Creatinine 0.57 - 1.00 mg/dL 7.82  9.56  2.13   Sodium 134 - 144 mmol/L 142  140  142   Potassium 3.5 - 5.2 mmol/L 4.0  4.0  3.5   Chloride 96 - 106 mmol/L 104  101  105   CO2 20 - 29 mmol/L 19  23  22    Calcium 8.7 - 10.3 mg/dL 9.7  9.7  9.5   Total Protein 6.0 -  8.5 g/dL 7.5  7.6  7.3   Total Bilirubin 0.0 - 1.2 mg/dL 0.3  0.3  0.3   Alkaline Phos 44 - 121 IU/L 105  100  80   AST 0 - 40 IU/L 20  19  19    ALT 0 - 32 IU/L 12  12  14     Lipid Panel     Component Value Date/Time   CHOL 183 11/30/2022 1012   TRIG 71 11/30/2022 1012   HDL 90 11/30/2022 1012   CHOLHDL 2.0 11/30/2022 1012   LDLCALC 80 11/30/2022 1012    CBC    Component Value Date/Time   WBC 4.4 08/30/2023 1012   WBC 5.1 10/22/2021 2151   RBC 4.75 08/30/2023 1012   RBC 5.01 10/22/2021 2151   HGB 13.1 08/30/2023 1012   HCT 40.3 08/30/2023 1012   PLT 252 08/30/2023 1012   MCV 85 08/30/2023 1012   MCH 27.6 08/30/2023 1012   MCH 27.9 10/22/2021 2151   MCHC 32.5 08/30/2023 1012   MCHC 33.4 10/22/2021 2151   RDW 13.2 08/30/2023 1012   LYMPHSABS 1.4 10/17/2021 2220   MONOABS 0.2 10/17/2021 2220   EOSABS 0.0 10/17/2021 2220   BASOSABS 0.0 10/17/2021 2220    ASSESSMENT AND PLAN: 1. Essential hypertension (Primary) At goal.  Continue amlodipine 5 mg daily.  2. Rheumatoid arthritis involving both hands with positive rheumatoid factor (HCC) No recent flareups.  Currently not on any medications.  She has an appointment scheduled with the rheumatologist Dr. Dimple Casey on the fourth of next month.  Encouraged her to make arrangements with medical transportation early so that she does not miss this appointment.  She expresses understanding.  3. Stage 3a chronic kidney disease (HCC) Stable GFR in the upper 50s.  We will continue to monitor.  4. Decreased  appetite Of questionable etiology.  She has not had any major weight loss over the past year.  Advised trying to supplement meals with boost or Ensure shakes but patient states she does not tolerate those because they upset her stomach.  Recommend eating fruits and a protein bar when she is not [place] to eat a full meal.  Advised to stop the over-the-counter vitamin that she is taking to try to increase her appetite since it causes morning headaches.  5. Morning headache See #4 above.  6. Osteoporosis monitoring declined Recommend bone density study for osteoporosis screening.  Patient declined.  7. Mammogram declined Recommend mammogram for breast cancer screening.  Patient declined.  8. Colon cancer screening declined Discussed colon cancer screening methods and recommendations to be screened.  Patient declined all screening methods.   Patient was given the opportunity to ask questions.  Patient verbalized understanding of the plan and was [place] to repeat key elements of the plan.   This documentation was completed using Paediatric nurse.  Any transcriptional errors are unintentional.  No orders of the defined types were placed in this encounter.    Requested Prescriptions    No prescriptions requested or ordered in this encounter    Return in about 4 months (around 05/04/2024).  Jonah Blue, MD, FACP

## 2024-01-03 NOTE — Patient Instructions (Signed)
 Tried drinking a fruit smoothie to supplement meals.  You can also eat 2 or 3 foods to supplement a meal when you do not feel like eating a full meal.  You have an appointment with the rheumatologist, Dr. Dimple Casey, on April 4 at 8:40 AM.  Please plan to keep that appointment.

## 2024-01-21 ENCOUNTER — Encounter: Payer: 59 | Admitting: Internal Medicine

## 2024-03-07 ENCOUNTER — Other Ambulatory Visit: Payer: Self-pay | Admitting: Internal Medicine

## 2024-03-07 DIAGNOSIS — F411 Generalized anxiety disorder: Secondary | ICD-10-CM

## 2024-03-07 DIAGNOSIS — G4700 Insomnia, unspecified: Secondary | ICD-10-CM

## 2024-03-07 DIAGNOSIS — I10 Essential (primary) hypertension: Secondary | ICD-10-CM

## 2024-04-23 ENCOUNTER — Other Ambulatory Visit: Payer: Self-pay | Admitting: Internal Medicine

## 2024-04-23 DIAGNOSIS — F411 Generalized anxiety disorder: Secondary | ICD-10-CM

## 2024-04-23 DIAGNOSIS — G4700 Insomnia, unspecified: Secondary | ICD-10-CM

## 2024-04-29 ENCOUNTER — Other Ambulatory Visit: Payer: Self-pay | Admitting: Internal Medicine

## 2024-04-29 DIAGNOSIS — I10 Essential (primary) hypertension: Secondary | ICD-10-CM

## 2024-05-01 ENCOUNTER — Other Ambulatory Visit: Payer: Self-pay

## 2024-05-04 ENCOUNTER — Ambulatory Visit: Admitting: Internal Medicine

## 2024-05-24 ENCOUNTER — Ambulatory Visit: Admitting: Nurse Practitioner

## 2024-06-15 ENCOUNTER — Encounter: Admitting: Internal Medicine

## 2024-06-15 NOTE — Progress Notes (Deleted)
   Office Visit Note  Patient: Erika Case             Date of Birth: 18-Sep-1951           MRN: [id number]             PCP: Vicci Barnie NOVAK, MD Referring: Vicci Barnie NOVAK, MD Visit Date: 06/15/2024 Occupation: @GUAROCC @  Subjective:  No chief complaint on file.   History of Present Illness: Ardice Grime is a 73 y.o. female ***     Activities of Daily Living:  Patient reports morning stiffness for *** {minute/hour:19697}.   Patient {ACTIONS;DENIES/REPORTS:21021675::Denies} nocturnal pain.  Difficulty dressing/grooming: {ACTIONS;DENIES/REPORTS:21021675::Denies} Difficulty climbing stairs: {ACTIONS;DENIES/REPORTS:21021675::Denies} Difficulty getting out of chair: {ACTIONS;DENIES/REPORTS:21021675::Denies} Difficulty using hands for taps, buttons, cutlery, and/or writing: {ACTIONS;DENIES/REPORTS:21021675::Denies}  No Rheumatology ROS completed.   PMFS History:  Patient Active Problem List   Diagnosis Date Noted   CKD (chronic kidney disease) stage 3, GFR 30-59 ml/min (HCC) 12/02/2022   Rheumatoid arthritis involving both hands (HCC) 12/02/2022   Dizziness 07/31/2021   Elevated blood pressure reading in office without diagnosis of hypertension 07/31/2021   Tinnitus of left ear 07/31/2021   Neuropathy 07/31/2021   Insomnia 07/31/2021   Anxiety about health 07/31/2021   Acute pyelonephritis 02/01/2021    Past Medical History:  Diagnosis Date   Hypertension     No family history on file. No past surgical history on file. Social History   Social History Narrative   Not on file    There is no immunization history on file for this patient.   Objective: Vital Signs: There were no vitals taken for this visit.   Physical Exam   Musculoskeletal Exam: ***  CDAI Exam: CDAI Score: -- Patient Global: --; Provider Global: -- Swollen: --; Tender: -- Joint Exam 06/15/2024   No joint exam has been documented for this visit   There is currently no information  documented on the homunculus. Go to the Rheumatology activity and complete the homunculus joint exam.  Investigation: No additional findings.  Imaging: No results found.  Recent Labs: Lab Results  Component Value Date   WBC 4.4 08/30/2023   HGB 13.1 08/30/2023   PLT 252 08/30/2023   NA 142 08/30/2023   K 4.0 08/30/2023   CL 104 08/30/2023   CO2 19 (L) 08/30/2023   GLUCOSE 92 08/30/2023   BUN 10 08/30/2023   CREATININE 1.04 (H) 08/30/2023   BILITOT 0.3 08/30/2023   ALKPHOS 105 08/30/2023   AST 20 08/30/2023   ALT 12 08/30/2023   PROT 7.5 08/30/2023   ALBUMIN 4.4 08/30/2023   CALCIUM 9.7 08/30/2023   GFRAA >60 10/19/2018    Speciality Comments: No specialty comments available.  Procedures:  No procedures performed Allergies: Patient has no known allergies.   Assessment / Plan:     Visit Diagnoses: No diagnosis found.  Orders: No orders of the defined types were placed in this encounter.  No orders of the defined types were placed in this encounter.   Face-to-face time spent with patient was *** minutes. Greater than 50% of time was spent in counseling and coordination of care.  Follow-Up Instructions: No follow-ups on file.   Lonni LELON Ester, MD  Note - This record has been created using [employer].  Chart creation errors have been sought, but may not always  have been located. Such creation errors do not reflect on  the standard of medical care.

## 2024-06-16 ENCOUNTER — Telehealth: Payer: Self-pay | Admitting: Internal Medicine

## 2024-06-16 NOTE — Telephone Encounter (Signed)
 Called patient to confirm upcoming appointment 06/20/2024. Patient appointment has been successfully confirmed

## 2024-06-20 ENCOUNTER — Ambulatory Visit: Attending: Internal Medicine | Admitting: Internal Medicine

## 2024-06-20 ENCOUNTER — Other Ambulatory Visit: Payer: Self-pay

## 2024-06-20 ENCOUNTER — Encounter: Payer: Self-pay | Admitting: Internal Medicine

## 2024-06-20 ENCOUNTER — Other Ambulatory Visit (HOSPITAL_COMMUNITY)
Admission: RE | Admit: 2024-06-20 | Discharge: 2024-06-20 | Disposition: A | Discharge status: Home / Self Care | Source: Ambulatory Visit | Attending: Internal Medicine | Admitting: Internal Medicine

## 2024-06-20 VITALS — BP 125/78 | HR 77 | Temp 98.4°F | Ht 66.0 in | Wt 160.0 lb

## 2024-06-20 DIAGNOSIS — M05742 Rheumatoid arthritis with rheumatoid factor of left hand without organ or systems involvement: Secondary | ICD-10-CM

## 2024-06-20 DIAGNOSIS — N760 Acute vaginitis: Secondary | ICD-10-CM | POA: Insufficient documentation

## 2024-06-20 DIAGNOSIS — R8290 Unspecified abnormal findings in urine: Secondary | ICD-10-CM

## 2024-06-20 DIAGNOSIS — Z2821 Immunization not carried out because of patient refusal: Secondary | ICD-10-CM

## 2024-06-20 DIAGNOSIS — M05741 Rheumatoid arthritis with rheumatoid factor of right hand without organ or systems involvement: Secondary | ICD-10-CM | POA: Diagnosis not present

## 2024-06-20 LAB — POCT URINALYSIS DIP (CLINITEK)
Bilirubin, UA: NEGATIVE
Blood, UA: NEGATIVE
Glucose, UA: NEGATIVE mg/dL
Ketones, POC UA: NEGATIVE mg/dL
Nitrite, UA: NEGATIVE
POC PROTEIN,UA: NEGATIVE
Spec Grav, UA: 1.015 (ref 1.010–1.025)
Urobilinogen, UA: 0.2 U/dL
pH, UA: 6.5 (ref 5.0–8.0)

## 2024-06-20 MED ORDER — FLUCONAZOLE 150 MG PO TABS
150.0000 mg | ORAL_TABLET | Freq: Every day | ORAL | 0 refills | Status: DC
  Filled 2024-06-20: qty 1, 1d supply, fill #0

## 2024-06-20 MED ORDER — SULFAMETHOXAZOLE-TRIMETHOPRIM 800-160 MG PO TABS
1.0000 | ORAL_TABLET | Freq: Two times a day (BID) | ORAL | 0 refills | Status: DC
  Filled 2024-06-20: qty 6, 3d supply, fill #0

## 2024-06-20 NOTE — Progress Notes (Signed)
 Patient ID: Erika Case, female    DOB: 22-Apr-1951  MRN: [id number]  CC: Urinary Tract Infection (UTI sx - urine strong odor, cloudy, lower back pain, vaginal itching  X3-4 weeks/No to flu vax. )   Subjective: Erika Case is a 73 y.o. female who presents for chronic ds management. Her concerns today include:  Patient with history of HTN, RA, CKD 3a, high-frequency sensory neuronal hearing loss, insomnia, anxiety   Discussed the use of AI scribe software for clinical note transcription with the patient, who gave verbal consent to proceed.  History of Present Illness Erika Case is a 73 year old female who presents with a strong smell in her urine and lower back pain.  She has been experiencing a strong smell in her urine for the past two to three weeks, which she associates with similar symptoms when she had previous bladder infections.  Strong smell has been associated with mild lower back pain on the left side.  Denies any dysuria or hematuria.  She has been experiencing some vaginal itching as well without discharge. She is not sexually active and has not had any recent sexual partners.  She has history of rheumatoid arthritis.  I have been maintaining her on low-dose of prednisone  until she rheumatologist.  We have tried getting her in with rheumatology for the past 1 year but patient continues to cancel or no-show appointments.  Last appointment was scheduled for 06/15/2024 but she no-showed that appointment.  She states that her joints no longer bother her and she is no longer taking prednisone  so she does not feel the need to see the rheumatologist.    Patient Active Problem List   Diagnosis Date Noted   CKD (chronic kidney disease) stage 3, GFR 30-59 ml/min (HCC) 12/02/2022   Rheumatoid arthritis involving both hands (HCC) 12/02/2022   Dizziness 07/31/2021   Elevated blood pressure reading in office without diagnosis of hypertension 07/31/2021   Tinnitus of left ear 07/31/2021    Neuropathy 07/31/2021   Insomnia 07/31/2021   Anxiety about health 07/31/2021   Acute pyelonephritis 02/01/2021     Current Outpatient Medications on File Prior to Visit  Medication Sig Dispense Refill   acetaminophen  (TYLENOL ) 325 MG tablet Take 325 mg by mouth at bedtime as needed (Sleep). Tylenol  PM.     amLODipine  (NORVASC ) 5 MG tablet Take 1 tablet by mouth once daily 90 tablet 0   hydrOXYzine  (ATARAX ) 25 MG tablet TAKE 1 TABLET BY MOUTH AT BEDTIME AS NEEDED 90 tablet 0   predniSONE  (DELTASONE ) 2.5 MG tablet Take 1 tablet by mouth once daily with breakfast 30 tablet 0   No current facility-administered medications on file prior to visit.    No Known Allergies  Social History   Socioeconomic History   Marital status: Single    Spouse name: Not on file   Number of children: 2   Years of education: Not on file   Highest education level: Not on file  Occupational History   Occupation: [employer]  Tobacco Use   Smoking status: Never   Smokeless tobacco: Never  Vaping Use   Vaping status: Never Used  Substance and Sexual Activity   Alcohol use: No   Drug use: No   Sexual activity: Not Currently  Other Topics Concern   Not on file  Social History Narrative   Not on file   Social Drivers of Health   Financial Resource Strain: Low Risk  (08/30/2023)   Overall Physicist, medical Strain (  CARDIA)    Difficulty of Paying Living Expenses: Not very hard  Food Insecurity: No Food Insecurity (08/30/2023)   Hunger Vital Sign    Worried About Running Out of Food in the Last Year: Never true    Ran Out of Food in the Last Year: Never true  Transportation Needs: No Transportation Needs (08/30/2023)   PRAPARE - Administrator, Civil Service (Medical): No    Lack of Transportation (Non-Medical): No  Physical Activity: Insufficiently Active (08/30/2023)   Exercise Vital Sign    Days of Exercise per Week: 5 days    Minutes of Exercise per Session: 20 min  Stress: No  Stress Concern Present (08/30/2023)   [employer] of Occupational Health - Occupational Stress Questionnaire    Feeling of Stress : Not at all  Social Connections: Socially Isolated (08/30/2023)   Social Connection and Isolation Panel    Frequency of Communication with Friends and Family: Twice a week    Frequency of Social Gatherings with Friends and Family: Once a week    Attends Religious Services: Never    Database administrator or Organizations: No    Attends Banker Meetings: Never    Marital Status: Never married  Intimate Partner Violence: Not At Risk (08/30/2023)   Humiliation, Afraid, Rape, and Kick questionnaire    Fear of Current or Ex-Partner: No    Emotionally Abused: No    Physically Abused: No    Sexually Abused: No    No family history on file.  No past surgical history on file.  ROS: Review of Systems Negative except as stated above  PHYSICAL EXAM: BP 125/78 (BP Location: Left Arm, Patient Position: Sitting, Cuff Size: Normal)   Pulse 77   Temp 98.4 F (36.9 C) (Oral)   Ht 5' 6 (1.676 m)   Wt 160 lb (72.6 kg)   SpO2 99%   BMI 25.82 kg/m   Physical Exam  General appearance - alert, well appearing, and in no distress Mental status - normal mood, behavior, speech, dress, motor activity, and thought processes Abdomen - soft, nontender, nondistended, no masses or organomegaly Musculoskeletal - no flank tenderness or tenderness of paraspinal muscles lumbar region.      Latest Ref Rng & Units 08/30/2023   10:12 AM 11/30/2022   10:12 AM 05/28/2022    9:35 AM  CMP  Glucose 70 - 99 mg/dL 92  94  87   BUN 8 - 27 mg/dL 10  9  8    Creatinine 0.57 - 1.00 mg/dL 8.95  8.93  8.99   Sodium 134 - 144 mmol/L 142  140  142   Potassium 3.5 - 5.2 mmol/L 4.0  4.0  3.5   Chloride 96 - 106 mmol/L 104  101  105   CO2 20 - 29 mmol/L 19  23  22    Calcium 8.7 - 10.3 mg/dL 9.7  9.7  9.5   Total Protein 6.0 - 8.5 g/dL 7.5  7.6  7.3   Total Bilirubin 0.0  - 1.2 mg/dL 0.3  0.3  0.3   Alkaline Phos 44 - 121 IU/L 105  100  80   AST 0 - 40 IU/L 20  19  19    ALT 0 - 32 IU/L 12  12  14     Lipid Panel     Component Value Date/Time   CHOL 183 11/30/2022 1012   TRIG 71 11/30/2022 1012   HDL 90 11/30/2022 1012   CHOLHDL  2.0 11/30/2022 1012   LDLCALC 80 11/30/2022 1012    CBC    Component Value Date/Time   WBC 4.4 08/30/2023 1012   WBC 5.1 10/22/2021 2151   RBC 4.75 08/30/2023 1012   RBC 5.01 10/22/2021 2151   HGB 13.1 08/30/2023 1012   HCT 40.3 08/30/2023 1012   PLT 252 08/30/2023 1012   MCV 85 08/30/2023 1012   MCH 27.6 08/30/2023 1012   MCH 27.9 10/22/2021 2151   MCHC 32.5 08/30/2023 1012   MCHC 33.4 10/22/2021 2151   RDW 13.2 08/30/2023 1012   LYMPHSABS 1.4 10/17/2021 2220   MONOABS 0.2 10/17/2021 2220   EOSABS 0.0 10/17/2021 2220   BASOSABS 0.0 10/17/2021 2220   Results for orders placed or performed in visit on 06/20/24  POCT URINALYSIS DIP (CLINITEK)   Collection Time: 06/20/24 10:59 AM  Result Value Ref Range   Color, UA yellow yellow   Clarity, UA cloudy (A) clear   Glucose, UA negative negative mg/dL   Bilirubin, UA negative negative   Ketones, POC UA negative negative mg/dL   Spec Grav, UA 8.984 8.989 - 1.025   Blood, UA negative negative   pH, UA 6.5 5.0 - 8.0   POC PROTEIN,UA negative negative, trace   Urobilinogen, UA 0.2 0.2 or 1.0 E.U./dL   Nitrite, UA Negative Negative   Leukocytes, UA Trace (A) Negative    ASSESSMENT AND PLAN: 1. Abnormal urine odor (Primary) UA is not overtly positive for UTI but will treat with Bactrim  for 3 days. - POCT URINALYSIS DIP (CLINITEK) - sulfamethoxazole -trimethoprim  (BACTRIM  DS) 800-160 MG tablet; Take 1 tablet by mouth 2 (two) times daily.  Dispense: 6 tablet; Refill: 0  2. Acute vaginitis Most likely yeast.  Will treat prophylactically with Diflucan .  She did do a vaginal swab today as well. - fluconazole  (DIFLUCAN ) 150 MG tablet; Take 1 tablet (150 mg total) by  mouth daily.  Dispense: 1 tablet; Refill: 0 - Cervicovaginal ancillary only  3. 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  Even though she is not having joint symptoms now, this can flareup anytime in the future and it would be best to have already become established with a rheumatologist.  It is unlikely that she would be [place] to get in with [facility]ology given that she canceled several times and recently no-showed appointment to get established.  She is agreeable to getting established but we will have to refer her outside of [facility] - Ambulatory referral to Rheumatology  4. Influenza vaccination declined  Patient was given the opportunity to ask questions.  Patient verbalized understanding of the plan and was [place] to repeat key elements of the plan.   This documentation was completed using Paediatric nurse.  Any transcriptional errors are unintentional.  No orders of the defined types were placed in this encounter.    Requested Prescriptions    No prescriptions requested or ordered in this encounter    No follow-ups on file.  Barnie Louder, MD, FACP

## 2024-06-20 NOTE — Patient Instructions (Signed)
 VISIT SUMMARY:  During your visit, we discussed your urinary symptoms, possible vaginal yeast infection, and rheumatoid arthritis management. We have initiated some tests and treatments to address your concerns.  YOUR PLAN:  -URINARY SYMPTOMS UNDER EVALUATION (POSSIBLE URINARY TRACT INFECTION): You have been experiencing a strong smell in your urine and lower back pain, which may indicate a urinary tract infection. We will perform a urinalysis and urine dip test to check for an infection.  -POSSIBLE VAGINAL YEAST INFECTION: You have reported vaginal itching without discharge or rash, which may be a vaginal yeast infection. We have prescribed Diflucan  (fluconazole ) to treat this. Additionally, you will perform a self-swab to check for sexually transmitted infections and yeast infection. Your prescription has been sent to the pharmacy on the first floor for your convenience.  -RHEUMATOID ARTHRITIS: Your rheumatoid arthritis is currently not causing symptoms. We have discontinued prednisone  and discussed the importance of managing potential flare-ups. It is crucial to keep your rheumatology appointments for ongoing management. We have referred you to a rheumatologist outside of [facility].  INSTRUCTIONS:  Please follow up with the urinalysis and urine dip test as soon as possible. Perform the self-swab as instructed and start taking the prescribed Diflucan . Make sure to keep your upcoming rheumatology appointment for continued management of your rheumatoid arthritis.

## 2024-06-21 ENCOUNTER — Ambulatory Visit: Payer: Self-pay | Admitting: Internal Medicine

## 2024-06-21 LAB — CERVICOVAGINAL ANCILLARY ONLY
Candida Glabrata: NEGATIVE
Candida Vaginitis: POSITIVE — AB
Chlamydia: NEGATIVE
Comment: NEGATIVE
Comment: NEGATIVE
Comment: NEGATIVE
Comment: NEGATIVE
Comment: NORMAL
Neisseria Gonorrhea: NEGATIVE
Trichomonas: NEGATIVE

## 2024-06-23 ENCOUNTER — Ambulatory Visit: Payer: Self-pay

## 2024-06-23 ENCOUNTER — Other Ambulatory Visit: Payer: Self-pay

## 2024-06-23 ENCOUNTER — Telehealth: Payer: Self-pay

## 2024-06-23 DIAGNOSIS — N760 Acute vaginitis: Secondary | ICD-10-CM

## 2024-06-23 DIAGNOSIS — R8290 Unspecified abnormal findings in urine: Secondary | ICD-10-CM

## 2024-06-23 MED ORDER — SULFAMETHOXAZOLE-TRIMETHOPRIM 800-160 MG PO TABS: 1.0000 | ORAL_TABLET | Freq: Two times a day (BID) | ORAL | 0 refills | Status: DC

## 2024-06-23 MED ORDER — FLUCONAZOLE 150 MG PO TABS: 150.0000 mg | ORAL_TABLET | Freq: Every day | ORAL | 0 refills | Status: DC

## 2024-06-23 NOTE — Telephone Encounter (Signed)
 FYI Only or Action Required?: Action required by provider: Patient requesting medication to be resent to a different pharmacy .  Patient was last seen in primary care on 06/20/2024 by Vicci Barnie NOVAK, MD.  Called Nurse Triage reporting Labs Only.  Symptoms began several days ago.  Interventions attempted: Nothing.  Symptoms are: stable.  Triage Disposition: Information or Advice Only Call  Patient/caregiver understands and will follow disposition?: Yes          Copied from CRM [id number]. Topic: Clinical - Lab/Test Results >> Jun 23, 2024 11:27 AM Debby BROCKS wrote: Reason for CRM: Patient would like to know more information about the results she received a call for on 06/21/2024 Reason for Disposition  Health information question, no triage required and triager Vien to answer question  Answer Assessment - Initial Assessment Questions This RN read test results for her vaginal swab that was confirmed to have yeast per her PCP. Patient also made aware that her medication was sent to a pharmacy on file for her. Pt requesting for medication to be resent to pharmacy below instead. Will route to office for follow up. Patient requesting a call back when medication is resent.   [id number] - [place] ([place]), [place] - [address]  [address], [place] ([place]) [place] [address]    1. REASON FOR CALL: What is the main reason for your call? or How can I best help you?     Lab results  2. SYMPTOMS : Do you have any symptoms?      Patient states she is still having the symptoms she discussed in office on 06/20/2024.  Protocols used: Information Only Call - No Triage-A-AH

## 2024-06-23 NOTE — Telephone Encounter (Addendum)
 Call to spoke with [facility] pharmacist,   fluconazole  (DIFLUCAN ) 150 MG tablet- needs the prescription transferred to [id number] - [place] ([place]), [place] - [address]   Medication transfered

## 2024-06-23 NOTE — Telephone Encounter (Signed)
 Copied from CRM [id number]. Topic: Clinical - Medication Question >> Jun 23, 2024  1:19 PM Delon DASEN wrote: Reason for CRM: fluconazole  (DIFLUCAN ) 150 MG tablet- needs the prescription transferred to [id number] - [place] ([place]), [place] - [address]

## 2024-06-29 ENCOUNTER — Ambulatory Visit: Payer: Self-pay | Admitting: Internal Medicine

## 2024-06-29 ENCOUNTER — Other Ambulatory Visit: Payer: Self-pay | Admitting: Internal Medicine

## 2024-06-29 DIAGNOSIS — N760 Acute vaginitis: Secondary | ICD-10-CM

## 2024-06-29 MED ORDER — FLUCONAZOLE 150 MG PO TABS: 150.0000 mg | ORAL_TABLET | Freq: Every day | ORAL | 0 refills | Status: DC

## 2024-06-29 NOTE — Telephone Encounter (Signed)
 This RN made 2nd attempt to reach patient. Call could not be completed as dialed. No other phone number on file.

## 2024-06-29 NOTE — Telephone Encounter (Signed)
 If pt calls back, let her know that the vaginal swab confirmed that she has a vaginal yeast infection.  If the 1 time Diflucan  pill did not help, we can prescribe it again for her to take for several days.  Prescription sent to her pharmacy.

## 2024-06-29 NOTE — Telephone Encounter (Signed)
 This RN made first attempt to contact pt the message your call could not be completed as dialed.               Copied from CRM #[id number]. Topic: Clinical - Prescription Issue >> Jun 29, 2024  1:02 PM Erika Case wrote: Reason for CRM: Patient is calling in because she needs to speak with Dr. Vicci because the medication she was prescribed doesn't seem to be working. Patient says she is currently still experiencing the itching and the discomfort. Patient says she has taken the medication as directed and she doesn't feel that it's not helping.

## 2024-06-30 ENCOUNTER — Ambulatory Visit: Payer: Self-pay

## 2024-06-30 NOTE — Telephone Encounter (Signed)
Requesting medication for yeast

## 2024-06-30 NOTE — Telephone Encounter (Signed)
 FYI Only or Action Required?: Action required by provider: update on patient condition and request for rx.  Patient was last seen in primary care on 06/20/2024 by Vicci Barnie NOVAK, MD.  Called Nurse Triage reporting Vaginal Itching.   Interventions attempted: Prescription medications: Diflucan .  Symptoms are: unchanged.  Triage Disposition: Call PCP Now  Patient/caregiver understands and will follow disposition?: No, wishes to speak with PCP        Copied from CRM #[id number]. Topic: Clinical - Medical Advice >> Jun 30, 2024  1:32 PM Donee H wrote: Reason for CRM:  Patient states returning missed phone call regarding issue from yesterday. She states medication that was prescribed isn't  working. Patient says she is currently still experiencing the itching and the discomfort. Patient says she has taken the medication as directed and she doesn't feel that it's not helping. Reason for Disposition  [1] Angry or rude caller AND [2] doesn't respond to 5 minutes of triager counseling AND [3] sick adult (or caller)  Answer Assessment - Initial Assessment Questions 1. SYMPTOM: What's the main symptom you're concerned about? (e.g., pain, itching, dryness)     Discomfort/itching 2. LOCATION: Where is the  *No Answer* located? (e.g., inside/outside, left/right)     *No Answer* 3. ONSET: When did the  *No Answer*  start?     *No Answer* 4. PAIN: Is there any pain? If Yes, ask: How bad is it? (Scale: 1-10; mild, moderate, severe)     *No Answer* 5. ITCHING: Is there any itching? If Yes, ask: How bad is it? (Scale: 1-10; mild, moderate, severe)     *No Answer* 6. CAUSE: What do you think is causing the discharge? Have you had the same problem before? What happened then?     *No Answer* 7. OTHER SYMPTOMS: Do you have any other symptoms? (e.g., fever, itching, vaginal bleeding, pain with urination, injury to genital area, vaginal foreign body)     Itching/discomfort 8.  PREGNANCY: Is there any chance you are pregnant? When was your last menstrual period?     N/a  Answer Assessment - Initial Assessment Questions 1. SITUATION:  Document reason for call.     Pt returning call re: vaginal discomfort/itching. Triager informed pt of PCP response from 06/29/24 NT encounter.  2. BACKGROUND: Document any background information (e.g., prior calls, known psychiatric history)     N/a 3. ASSESSMENT: Document your nursing assessment.     Pt frustrated with treatment and continued sx 4. RESPONSE: Document what your response or recommendation was.     Pt reports that she has already taken Diflucan  x 2 and reports that it is not working. Triager will forward encounter for Dr Vicci 's office to review and advise. Patient verbalized understanding and is expecting call back from office for next steps.  Protocols used: Vaginal Symptoms-A-AH, Difficult Call-A-AH

## 2024-06-30 NOTE — Telephone Encounter (Signed)
 Called but no answer. LVM to call back.

## 2024-06-30 NOTE — Telephone Encounter (Signed)
 Pt calling again to check on status of Rx. Triager advised that Rx requested needs to be reviewed by PCP.  Pt asking if PCP still in office. Triager called CAL to confirm PCP in office, and she is. Patient notified of information and will continue to wait for PCP response.

## 2024-07-01 NOTE — Telephone Encounter (Signed)
 Phone call placed to patient this morning. Patient informed that the wet prep that was done confirms that she has vaginal yeast.  Since she is still having itching with just the one-time dose of Diflucan , I have sent a prescription to her pharmacy for her to take daily for the next 3 days.  If itching still persist, then she would need to be seen for us  to take a look at the vaginal area. Pt expressed understanding.

## 2024-08-18 ENCOUNTER — Emergency Department (HOSPITAL_COMMUNITY)
Admission: EM | Admit: 2024-08-18 | Discharge: 2024-08-18 | Disposition: A | Discharge status: Home / Self Care | Attending: Emergency Medicine | Admitting: Emergency Medicine

## 2024-08-18 ENCOUNTER — Other Ambulatory Visit: Payer: Self-pay

## 2024-08-18 ENCOUNTER — Emergency Department (HOSPITAL_COMMUNITY)

## 2024-08-18 ENCOUNTER — Encounter (HOSPITAL_COMMUNITY): Payer: Self-pay

## 2024-08-18 DIAGNOSIS — N189 Chronic kidney disease, unspecified: Secondary | ICD-10-CM | POA: Insufficient documentation

## 2024-08-18 DIAGNOSIS — Z79899 Other long term (current) drug therapy: Secondary | ICD-10-CM | POA: Diagnosis not present

## 2024-08-18 DIAGNOSIS — M545 Low back pain, unspecified: Secondary | ICD-10-CM | POA: Insufficient documentation

## 2024-08-18 DIAGNOSIS — I129 Hypertensive chronic kidney disease with stage 1 through stage 4 chronic kidney disease, or unspecified chronic kidney disease: Secondary | ICD-10-CM | POA: Diagnosis not present

## 2024-08-18 LAB — BASIC METABOLIC PANEL WITH GFR
Anion gap: 13 (ref 5–15)
BUN: 12 mg/dL (ref 8–23)
CO2: 22 mmol/L (ref 22–32)
Calcium: 9.4 mg/dL (ref 8.9–10.3)
Chloride: 102 mmol/L (ref 98–111)
Creatinine, Ser: 0.95 mg/dL (ref 0.44–1.00)
GFR, Estimated: >60 mL/min (ref >60)
Glucose, Bld: 108 mg/dL — ABNORMAL HIGH (ref 70–99)
Potassium: 3.6 mmol/L (ref 3.5–5.1)
Sodium: 137 mmol/L (ref 135–145)

## 2024-08-18 LAB — URINALYSIS, ROUTINE W REFLEX MICROSCOPIC
Bilirubin Urine: NEGATIVE
Glucose, UA: NEGATIVE mg/dL
Hgb urine dipstick: NEGATIVE
Ketones, ur: 20 mg/dL — AB
Nitrite: NEGATIVE
Protein, ur: NEGATIVE mg/dL
Specific Gravity, Urine: 1.018 (ref 1.005–1.030)
pH: 7 (ref 5.0–8.0)

## 2024-08-18 LAB — CBC WITH DIFFERENTIAL/PLATELET
Abs Immature Granulocytes: 0.04 K/uL (ref 0.00–0.07)
Basophils Absolute: 0 K/uL (ref 0.0–0.1)
Basophils Relative: 0 %
Eosinophils Absolute: 0 K/uL (ref 0.0–0.5)
Eosinophils Relative: 0 %
HCT: 39.1 % (ref 36.0–46.0)
Hemoglobin: 12.3 g/dL (ref 12.0–15.0)
Immature Granulocytes: 0 %
Lymphocytes Relative: 11 %
Lymphs Abs: 1 K/uL (ref 0.7–4.0)
MCH: 26.6 pg (ref 26.0–34.0)
MCHC: 31.5 g/dL (ref 30.0–36.0)
MCV: 84.6 fL (ref 80.0–100.0)
Monocytes Absolute: 0.6 K/uL (ref 0.1–1.0)
Monocytes Relative: 6 %
Neutro Abs: 7.5 K/uL (ref 1.7–7.7)
Neutrophils Relative %: 83 %
Platelets: 219 K/uL (ref 150–400)
RBC: 4.62 MIL/uL (ref 3.87–5.11)
RDW: 13.7 % (ref 11.5–15.5)
WBC: 9.2 K/uL (ref 4.0–10.5)
nRBC: 0 % (ref 0.0–0.2)

## 2024-08-18 MED ORDER — MORPHINE SULFATE (PF) 4 MG/ML IV SOLN
4.0000 mg | Freq: Once | INTRAVENOUS | Status: AC
  Administered 2024-08-18: 4 mg via INTRAVENOUS
  Filled 2024-08-18: qty 1

## 2024-08-18 MED ORDER — TRAMADOL HCL 50 MG PO TABS: 50.0000 mg | ORAL_TABLET | Freq: Four times a day (QID) | ORAL | 0 refills | Status: AC | PRN

## 2024-08-18 MED ORDER — DIAZEPAM 5 MG PO TABS
5.0000 mg | ORAL_TABLET | Freq: Once | ORAL | Status: AC
  Administered 2024-08-18: 5 mg via ORAL
  Filled 2024-08-18: qty 1

## 2024-08-18 MED ORDER — ACETAMINOPHEN 500 MG PO TABS
1000.0000 mg | ORAL_TABLET | Freq: Once | ORAL | Status: AC
  Administered 2024-08-18: 1000 mg via ORAL
  Filled 2024-08-18: qty 2

## 2024-08-18 NOTE — Discharge Instructions (Signed)
 Your back pain is likely muscle strain.  However CT scan cannot rule out a kidney stone on the right side causing discomfort.  You may take medication as prescribed but if you have persistent pain you may follow-up with urologist for outpatient evaluation and management.  Be aware medication can cause drowsiness.

## 2024-08-18 NOTE — ED Provider Notes (Signed)
 [place] EMERGENCY DEPARTMENT AT [facility] Provider Note   CSN: [id number] Arrival date & time: 08/18/24  1702     Patient presents with: Back Pain   Erika Case is a 73 y.o. female.   The history is provided by the patient and medical records. No language interpreter was used.  Back Pain    73 year old female history of hypertension, CKD, rheumatoid arthritis presented to ER with complaint of back pain.  Patient states that she developed pain to her low back about 3 days ago after she did some heavy house plant.  Pain is described as a tightness sharp sensation to her left lower back which has now go across her right lower back.  Pain is worsening with movement but does not travel down her legs.  No abdominal pain no bowel bladder incontinence or saddle anesthesia no fever or chills no urine discomfort and no hematuria.  She mention she tries numerous over-the-counter medication at home without relief.  Prior to Admission medications   Medication Sig Start Date End Date Taking? Authorizing Provider  acetaminophen  (TYLENOL ) 325 MG tablet Take 325 mg by mouth at bedtime as needed (Sleep). Tylenol  PM.    [provider]  amLODipine  (NORVASC ) 5 MG tablet Take 1 tablet by mouth once daily 05/01/24   Erika Barnie NOVAK, MD  fluconazole  (DIFLUCAN ) 150 MG tablet Take 1 tablet (150 mg total) by mouth daily. 06/29/24   Erika Barnie NOVAK, MD  hydrOXYzine  (ATARAX ) 25 MG tablet TAKE 1 TABLET BY MOUTH AT BEDTIME AS NEEDED 04/28/24   Erika Barnie NOVAK, MD  sulfamethoxazole -trimethoprim  (BACTRIM  DS) 800-160 MG tablet Take 1 tablet by mouth 2 (two) times daily. 06/23/24   Erika Barnie NOVAK, MD    Allergies: Patient has no known allergies.    Review of Systems  Musculoskeletal:  Positive for back pain.    Updated Vital Signs BP (!) 150/98 (BP Location: Right Arm)   Pulse (!) 106   Temp 97.9 F (36.6 C) (Oral)   Resp 18   Ht 5' 6 (1.676 m)   Wt 72.6 kg   SpO2 98%    BMI 25.82 kg/m   Physical Exam Vitals and nursing note reviewed.  Constitutional:      General: She is not in acute distress.    Appearance: She is well-developed.     Comments: Patient is laying in the left lateral decubitus position appears to be in some mild discomfort.  HENT:     Head: Atraumatic.  Eyes:     Conjunctiva/sclera: Conjunctivae normal.  Pulmonary:     Effort: Pulmonary effort is normal.  Abdominal:     Palpations: Abdomen is soft.     Tenderness: There is no abdominal tenderness.  Musculoskeletal:        General: Tenderness (Tenderness to lumbar and paralumbar spinal muscle on palpation no overlying skin changes no rash no crepitus or step-off.) present.     Cervical back: Neck supple.     Comments: 5 out of 5 strength to bilateral lower extremities with intact sensation and intact distal pulses.  Skin:    Findings: No rash.  Neurological:     Mental Status: She is alert.  Psychiatric:        Mood and Affect: Mood normal.     (all labs ordered are listed, but only abnormal results are displayed) Labs Reviewed  URINALYSIS, ROUTINE W REFLEX MICROSCOPIC - Abnormal; Notable for the following components:      Result Value  APPearance HAZY (*)    Ketones, ur 20 (*)    Leukocytes,Ua SMALL (*)    Bacteria, UA RARE (*)    All other components within normal limits  BASIC METABOLIC PANEL WITH GFR - Abnormal; Notable for the following components:   Glucose, Bld 108 (*)    All other components within normal limits  CBC WITH DIFFERENTIAL/PLATELET    EKG: None  Radiology: CT Renal Stone Study Result Date: 08/18/2024 EXAM: CT ABDOMEN AND PELVIS WITHOUT CONTRAST 08/18/2024 08:20:13 PM TECHNIQUE: CT of the abdomen and pelvis was performed without the administration of intravenous contrast. Multiplanar reformatted images are provided for review. Automated exposure control, iterative reconstruction, and/or weight-based adjustment of the mA/kV was utilized to reduce  the radiation dose to as low as reasonably achievable. COMPARISON: 02/01/2021 CLINICAL HISTORY: Abdominal/flank pain, stone suspected. FINDINGS: LOWER CHEST: Stable large hiatal hernia. LIVER: The liver is unremarkable. GALLBLADDER AND BILE DUCTS: Gallbladder is unremarkable. No biliary ductal dilatation. SPLEEN: No acute abnormality. PANCREAS: No acute abnormality. ADRENAL GLANDS: No acute abnormality. KIDNEYS, URETERS AND BLADDER: Bilateral nonobstructing renal stones. No hydronephrosis. The ureters are difficult to follow due to their decompressed state. There is a 4 mm calcification along the right pelvic brim in the region of the mid right ureter. This calcification was not present on the prior study. It is difficult to completely exclude a nonobstructing right ureteral stone. No perinephric or periureteral stranding. Urinary bladder is unremarkable. GI AND BOWEL: Stomach demonstrates no acute abnormality. Moderate stool burden in the Case. There is no bowel obstruction. PERITONEUM AND RETROPERITONEUM: No ascites. No free air. VASCULATURE: Aorta is normal in caliber. Aortic atherosclerosis. LYMPH NODES: No lymphadenopathy. REPRODUCTIVE ORGANS: No acute abnormality. BONES AND SOFT TISSUES: No acute osseous abnormality. No focal soft tissue abnormality. IMPRESSION: 1. Bilateral nonobstructing renal stones without hydronephrosis. Difficult to exclude a nonobstructing right mid ureteral stone given new 4 mm calcification along the right pelvic brim; consider urologic follow-up if symptoms persist or worsen. 2. Stable large hiatal hernia. Electronically signed by: Erika Crease MD 08/18/2024 08:32 PM EDT RP Workstation: [id number]   DG Lumbar Spine Complete Result Date: 08/18/2024 CLINICAL DATA:  Right lower back pain radiating down right leg EXAM: LUMBAR SPINE - COMPLETE 4+ VIEW COMPARISON:  02/01/2021 FINDINGS: Frontal, bilateral oblique, lateral views of the lumbar spine are obtained on 5 images. There are 5  non-rib-bearing lumbar type vertebral bodies. Grade 1 degenerative anterolisthesis of L4 on L5 and L5 on S1. Otherwise alignment is anatomic. No acute fractures. Disc spaces are well preserved. Multilevel facet hypertrophic changes greatest from L3-4 through L5-S1. Sacroiliac joints are unremarkable. IMPRESSION: 1. Lower lumbar facet hypertrophic changes as above. 2. No acute fracture. Electronically Signed   By: Erika Case M.D.   On: 08/18/2024 19:01     Procedures   Medications Ordered in the ED  diazepam (VALIUM) tablet 5 mg (5 mg Oral Given 08/18/24 1759)  acetaminophen  (TYLENOL ) tablet 1,000 mg (1,000 mg Oral Given 08/18/24 1935)  morphine  (PF) 4 MG/ML injection 4 mg (4 mg Intravenous Given 08/18/24 2037)                                    Medical Decision Making Amount and/or Complexity of Data Reviewed Labs: ordered. Radiology: ordered.  Risk Prescription drug management.   BP (!) 150/98 (BP Location: Right Arm)   Pulse (!) 106   Temp 97.9 F (36.6 C) (  Oral)   Resp 18   Ht 5' 6 (1.676 m)   Wt 72.6 kg   SpO2 98%   BMI 25.82 kg/m   11:107 PM 73 year old female history of hypertension, CKD, rheumatoid arthritis presented to ER with complaint of back pain.  Patient states that she developed pain to her low back about 3 days ago after she did some heavy house plant.  Pain is described as a tightness sharp sensation to her left lower back which has now go across her right lower back.  Pain is worsening with movement but does not travel down her legs.  No abdominal pain no bowel bladder incontinence or saddle anesthesia no fever or chills no urine discomfort and no hematuria.  She mention she tries numerous over-the-counter medication at home without relief.  On exam patient does have reproducible tenderness to her lumbar and paralumbar lumbar spinal muscle.  Suspect this is likely muscle skeletal pain such as strain or sprain.  She is without any leg weakness and she is  neurovascular intact.  Doubt cauda equina or sciatica.  No concerning rash, doubt shingles.  No urinary complaint, doubt kidney stone.  She is uncomfortable, will give 5 milligrams of valium.  Patient report minimal improvement with Valium.  Tylenol  was given, on reexamination she still has tenderness across her lower back but no abdominal discomfort.  Will obtain abdominal pelvis CT scan for further assessment.  -Labs ordered, independently viewed and interpreted by me.  Labs remarkable for UA without vidence of UTI, normal WBC, normal H&H -The patient was maintained on a cardiac monitor.  I personally viewed and interpreted the cardiac monitored which showed an underlying rhythm of: NSR -Imaging independently viewed and interpreted by me and I agree with radiologist's interpretation.  Result remarkable for Lspine xary without acute fx, abd/pelvic  CT showing bilateral nonobstructive kidney stone but difficult to exclude nonobstructing R mid ureteral stone.  Will have pt f/u with urology if sxs persists -This patient presents to the ED for concern of back pain, this involves an extensive number of treatment options, and is a complaint that carries with it a high risk of complications and morbidity.  The differential diagnosis includes muscle strain, sprain, kidney stone, pyelonephritis, dissection, shingles -Co morbidities that complicate the patient evaluation includes ckd, RA, HTN -Treatment includes valium, tylenol , morphine  -Reevaluation of the patient after these medicines showed that the patient improved -PCP office notes or outside notes reviewed -Discussion with attending Dr. Randol -Escalation to admission/observation considered: patients feels much better, is comfortable with discharge, and will follow up with urology -Prescription medication considered, patient comfortable with tramadol  -Social Determinant of Health considered which includes socially isolated      Final diagnoses:   Acute bilateral low back pain without sciatica    ED Discharge Orders          Ordered    traMADol  (ULTRAM ) 50 MG tablet  Every 6 hours PRN        08/18/24 2152               Erika Colon, PA-C 08/18/24 2157    Randol Simmonds, MD 08/19/24 [id number]

## 2024-08-18 NOTE — ED Triage Notes (Signed)
 Pt arrives to triage via wheelchair with complaints of sharp back pain in her RIGHT lower back that is moving down her leg. Pt reports that the pain began 2-3 days ago and has persisted. Pt reports pain with movement and at rest.

## 2024-08-18 NOTE — ED Notes (Signed)
 Family at bedside.

## 2024-08-18 NOTE — ED Notes (Signed)
 Pt in CT.

## 2024-08-18 NOTE — ED Notes (Signed)
Pt requesting pain medication. Greta DoomBowie PA notified.

## 2024-08-23 ENCOUNTER — Emergency Department (HOSPITAL_COMMUNITY)
Admission: EM | Admit: 2024-08-23 | Discharge: 2024-08-23 | Disposition: A | Discharge status: Home / Self Care | Attending: Emergency Medicine | Admitting: Emergency Medicine

## 2024-08-23 ENCOUNTER — Other Ambulatory Visit: Payer: Self-pay

## 2024-08-23 DIAGNOSIS — R300 Dysuria: Secondary | ICD-10-CM | POA: Diagnosis present

## 2024-08-23 DIAGNOSIS — N3090 Cystitis, unspecified without hematuria: Secondary | ICD-10-CM | POA: Diagnosis not present

## 2024-08-23 LAB — URINALYSIS, ROUTINE W REFLEX MICROSCOPIC
Bilirubin Urine: NEGATIVE
Glucose, UA: NEGATIVE mg/dL
Hgb urine dipstick: NEGATIVE
Ketones, ur: NEGATIVE mg/dL
Nitrite: POSITIVE — AB
Protein, ur: NEGATIVE mg/dL
Specific Gravity, Urine: 1.009 (ref 1.005–1.030)
WBC, UA: >50 WBC/hpf (ref 0–5)
pH: 8 (ref 5.0–8.0)

## 2024-08-23 LAB — BASIC METABOLIC PANEL WITH GFR
Anion gap: 16 — ABNORMAL HIGH (ref 5–15)
BUN: 15 mg/dL (ref 8–23)
CO2: 22 mmol/L (ref 22–32)
Calcium: 9.3 mg/dL (ref 8.9–10.3)
Chloride: 97 mmol/L — ABNORMAL LOW (ref 98–111)
Creatinine, Ser: 1.25 mg/dL — ABNORMAL HIGH (ref 0.44–1.00)
GFR, Estimated: 46 mL/min — ABNORMAL LOW (ref >60)
Glucose, Bld: 109 mg/dL — ABNORMAL HIGH (ref 70–99)
Potassium: 3.6 mmol/L (ref 3.5–5.1)
Sodium: 135 mmol/L (ref 135–145)

## 2024-08-23 LAB — CBC WITH DIFFERENTIAL/PLATELET
Abs Immature Granulocytes: 0.02 K/uL (ref 0.00–0.07)
Basophils Absolute: 0.1 K/uL (ref 0.0–0.1)
Basophils Relative: 1 %
Eosinophils Absolute: 0.3 K/uL (ref 0.0–0.5)
Eosinophils Relative: 5 %
HCT: 39.3 % (ref 36.0–46.0)
Hemoglobin: 12.4 g/dL (ref 12.0–15.0)
Immature Granulocytes: 0 %
Lymphocytes Relative: 45 %
Lymphs Abs: 2.5 K/uL (ref 0.7–4.0)
MCH: 26.7 pg (ref 26.0–34.0)
MCHC: 31.6 g/dL (ref 30.0–36.0)
MCV: 84.7 fL (ref 80.0–100.0)
Monocytes Absolute: 0.4 K/uL (ref 0.1–1.0)
Monocytes Relative: 8 %
Neutro Abs: 2.2 K/uL (ref 1.7–7.7)
Neutrophils Relative %: 41 %
Platelets: 327 K/uL (ref 150–400)
RBC: 4.64 MIL/uL (ref 3.87–5.11)
RDW: 13.2 % (ref 11.5–15.5)
WBC: 5.5 K/uL (ref 4.0–10.5)
nRBC: 0 % (ref 0.0–0.2)

## 2024-08-23 MED ORDER — CEPHALEXIN 250 MG PO CAPS
500.0000 mg | ORAL_CAPSULE | Freq: Once | ORAL | Status: AC
  Administered 2024-08-23: 500 mg via ORAL
  Filled 2024-08-23: qty 2

## 2024-08-23 MED ORDER — CEPHALEXIN 500 MG PO CAPS: 500.0000 mg | ORAL_CAPSULE | Freq: Four times a day (QID) | ORAL | 0 refills | Status: AC

## 2024-08-23 NOTE — ED Notes (Signed)
 Unable to provide urine, sample cup in hand

## 2024-08-23 NOTE — ED Notes (Signed)
Pt unable to provide urine

## 2024-08-23 NOTE — Discharge Instructions (Addendum)
 Your blood work was reassuring.  Your urine showed evidence of urinary tract infection.  You received a dose of antibiotic in the emergency department.  Have sent additional course into the pharmacy for you.  Please return for any worsening symptoms. Increase how much water you are drinking.  Cut back on caffeine for the next few days.  Your previous CT scan a few days ago showed potential stone in your right ureter.  Since you are having slight discomfort still we discussed obtaining a repeat CT scan but you declined and would like to have this done later with your primary care doctor.  Make them aware of this and they can repeat it if your symptoms continue.

## 2024-08-23 NOTE — ED Provider Notes (Signed)
 [place] EMERGENCY DEPARTMENT AT [facility] Provider Note   CSN: [id number] Arrival date & time: 08/23/24  9287     Patient presents with: Dysuria   Erika Case is a 73 y.o. female.   73 year old female presents today for concern of dysuria.  Has been ongoing for the past 2 days.  Has some right-sided flank pain but this is been going on for some time and states has significantly improved in the past couple days.  She was recently evaluated for this.  No blood in her urine.  No nausea, or vomiting.  No abdominal pain.  The history is provided by the patient. No language interpreter was used.  Dysuria Associated symptoms: no abdominal pain, no fever, no nausea and no vomiting        Prior to Admission medications   Medication Sig Start Date End Date Taking? Authorizing Provider  cephALEXin  (KEFLEX ) 500 MG capsule Take 1 capsule (500 mg total) by mouth 4 (four) times daily for 7 days. 08/23/24 08/30/24 Yes Erle Guster, PA-C  acetaminophen  (TYLENOL ) 325 MG tablet Take 325 mg by mouth at bedtime as needed (Sleep). Tylenol  PM.    [provider]  amLODipine  (NORVASC ) 5 MG tablet Take 1 tablet by mouth once daily 05/01/24   Vicci Barnie NOVAK, MD  fluconazole  (DIFLUCAN ) 150 MG tablet Take 1 tablet (150 mg total) by mouth daily. 06/29/24   Vicci Barnie NOVAK, MD  hydrOXYzine  (ATARAX ) 25 MG tablet TAKE 1 TABLET BY MOUTH AT BEDTIME AS NEEDED 04/28/24   Vicci Barnie NOVAK, MD  sulfamethoxazole -trimethoprim  (BACTRIM  DS) 800-160 MG tablet Take 1 tablet by mouth 2 (two) times daily. 06/23/24   Vicci Barnie NOVAK, MD  traMADol  (ULTRAM ) 50 MG tablet Take 1 tablet (50 mg total) by mouth every 6 (six) hours as needed for moderate pain (pain score 4-6). 08/18/24   Nivia Colon, PA-C    Allergies: Patient has no known allergies.    Review of Systems  Constitutional:  Negative for activity change and fever.  Respiratory:  Negative for shortness of breath.   Cardiovascular:  Negative  for chest pain.  Gastrointestinal:  Negative for abdominal pain, nausea and vomiting.  Genitourinary:  Positive for dysuria.  All other systems reviewed and are negative.   Updated Vital Signs BP 124/84   Pulse 90   Temp 97.8 F (36.6 C) (Oral)   Resp 16   SpO2 100%   Physical Exam Vitals and nursing note reviewed.  Constitutional:      General: She is not in acute distress.    Appearance: Normal appearance. She is not ill-appearing.  HENT:     Head: Normocephalic and atraumatic.     Nose: Nose normal.  Eyes:     Conjunctiva/sclera: Conjunctivae normal.  Cardiovascular:     Rate and Rhythm: Normal rate and regular rhythm.  Pulmonary:     Effort: Pulmonary effort is normal. No respiratory distress.  Abdominal:     General: There is no distension.     Palpations: Abdomen is soft.     Tenderness: There is no abdominal tenderness. There is no right CVA tenderness or left CVA tenderness.  Musculoskeletal:        General: No deformity. Normal range of motion.  Skin:    Findings: No rash.  Neurological:     Mental Status: She is alert.     (all labs ordered are listed, but only abnormal results are displayed) Labs Reviewed  URINALYSIS, ROUTINE W REFLEX MICROSCOPIC -  Abnormal; Notable for the following components:      Result Value   APPearance CLOUDY (*)    Nitrite POSITIVE (*)    Leukocytes,Ua LARGE (*)    Bacteria, UA MANY (*)    All other components within normal limits  BASIC METABOLIC PANEL WITH GFR - Abnormal; Notable for the following components:   Chloride 97 (*)    Glucose, Bld 109 (*)    Creatinine, Ser 1.25 (*)    GFR, Estimated 46 (*)    Anion gap 16 (*)    All other components within normal limits  CBC WITH DIFFERENTIAL/PLATELET    EKG: None  Radiology: No results found.   Procedures   Medications Ordered in the ED  cephALEXin  (KEFLEX ) capsule 500 mg (has no administration in time range)                                    Medical  Decision Making Amount and/or Complexity of Data Reviewed Labs: ordered.  Risk Prescription drug management.   73 year old female presents today for concern of dysuria. No other complaints.  Overall well-appearing. Abdomen is soft and without distention or tenderness. Hemodynamically she is stable. CBC unremarkable, BMP with creatinine of 1.25, gap of 16 otherwise no acute concerns.  Discussed increased hydration and cutting back on caffeine. UA with evidence of UTI.  Keflex  prescribed. Urine culture sent.  Discussed repeat CT given persistent but mild right flank pain.  No CVA tenderness on exam.  Previous ED showed potential ureteral stone.  She declines and will have her PCP follow-up on this.  No hemoglobin in her urine.  Feel this is reasonable.  Discharged in stable condition.  Final diagnoses:  Cystitis    ED Discharge Orders          Ordered    cephALEXin  (KEFLEX ) 500 MG capsule  4 times daily        08/23/24 1016               Hildegard Loge, PA-C 08/23/24 1022    Tonia Chew, MD 08/23/24 1024

## 2024-08-23 NOTE — ED Triage Notes (Signed)
 Pt. Stated, I've had burning when I pee.

## 2024-08-28 ENCOUNTER — Telehealth: Payer: Self-pay | Admitting: Internal Medicine

## 2024-08-28 NOTE — Telephone Encounter (Signed)
 Copied from CRM [id number]. Topic: Appointments - Scheduling Inquiry for Clinic >> Aug 28, 2024  9:41 AM Tobias CROME wrote:  Reason for CRM: Patient requesting an hospital f/u. Patient discharged on 08/23/2024. No openings available within 14 days.   Best callback number: [phone]

## 2024-08-28 NOTE — Telephone Encounter (Signed)
 Contacted patient. Patient has been scheduled for a hospital f/u appointment 11/24 at 9:10 am with Dr. Vicci.

## 2024-08-29 ENCOUNTER — Other Ambulatory Visit: Payer: Self-pay | Admitting: Internal Medicine

## 2024-08-29 DIAGNOSIS — F411 Generalized anxiety disorder: Secondary | ICD-10-CM

## 2024-08-29 DIAGNOSIS — G4700 Insomnia, unspecified: Secondary | ICD-10-CM

## 2024-09-11 ENCOUNTER — Encounter: Payer: Self-pay | Admitting: Internal Medicine

## 2024-09-11 ENCOUNTER — Ambulatory Visit: Attending: Internal Medicine | Admitting: Internal Medicine

## 2024-09-11 VITALS — BP 125/86 | HR 100 | Ht 66.0 in | Wt 153.0 lb

## 2024-09-11 DIAGNOSIS — N289 Disorder of kidney and ureter, unspecified: Secondary | ICD-10-CM

## 2024-09-11 DIAGNOSIS — I10 Essential (primary) hypertension: Secondary | ICD-10-CM | POA: Diagnosis not present

## 2024-09-11 DIAGNOSIS — R42 Dizziness and giddiness: Secondary | ICD-10-CM

## 2024-09-11 DIAGNOSIS — M545 Low back pain, unspecified: Secondary | ICD-10-CM

## 2024-09-11 DIAGNOSIS — N201 Calculus of ureter: Secondary | ICD-10-CM

## 2024-09-11 MED ORDER — AMLODIPINE BESYLATE 5 MG PO TABS: 5.0000 mg | ORAL_TABLET | Freq: Every day | ORAL | 0 refills | Status: AC

## 2024-09-11 MED ORDER — PREDNISONE 20 MG PO TABS: ORAL_TABLET | ORAL | 0 refills | Status: DC

## 2024-09-11 NOTE — Progress Notes (Signed)
 Patient ID: Erika Case, female    DOB: 28-Oct-1950  MRN: [id number]  CC: Follow-up (ER f/u. /On-going back pain X3 weeks - pain has lessened/Dizziness X3 weeks/No to flu vax)   Subjective: Erika Case is a 73 y.o. female who presents for ER. 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  History of Present Illness   Erika Case is a 74 year old female who presents for follow-up after recent ER visits for back pain and urinary tract infections.  She has persistent back pain that began around August 18, 2024, after lifting heavy potted houseplants. The pain is described as a pressure across the lower back, primarily in the middle but sometimes she finds it hard to tell if more on one side or the other. It does not radiate down the legs but is associated with occasional tingling in the big toes; not sure if this was happening prior to back pain. No numbness in the legs. The pain is constant, worsens at night, and affects her sleep. She has tried various topical treatments and over-the-counter medications, including Tylenol , ibuprofen, BC powders, Biofreeze, Icy Hot, and hemp rubs, with little relief. Given Tramadol  on first ER visit 08/18/24 that provided temporary relief but was limited to a short course. X-ray of the lumbar spine revealed lower lumbar facet hypertrophic changes.  Mention was grade 1 degenerative anterior listhesis of L4 on L5 and L5 on S1.  Disc spaces are well-preserved.  Renal stone protocol showed bilateral nonobstructing renal stones without hydronephrosis but difficult to exclude a nonobstructing right mid ureteral stone given new 4 mm calcification along the right pelvic brim. CBC normal x 2.  Seen in ER again 08/23/24 with dysuria x 2 days; UA c/w UTI; no  blood in urine. Given Keflex .  I note that her GFR had declined from >60 on 08/18/24 to 46 with Creat 1.25 on ER visit 08/23/24. Patient reports that her urinary symptoms have resolved post antibiotics.  No hematuria.  She experiences persistent dizziness, particularly upon waking. This symptom began after her last hospital visit and persists despite discontinuing pain medications. No HA. She drinks three to four bottles of water daily.  HTN: needs RF on Norvasc  5 mg     Patient Active Problem List   Diagnosis Date Noted   CKD (chronic kidney disease) stage 3, GFR 30-59 ml/min (HCC) 12/02/2022   Rheumatoid arthritis involving both hands (HCC) 12/02/2022   Dizziness 07/31/2021   Elevated blood pressure reading in office without diagnosis of hypertension 07/31/2021   Tinnitus of left ear 07/31/2021   Neuropathy 07/31/2021   Insomnia 07/31/2021   Anxiety about health 07/31/2021   Acute pyelonephritis 02/01/2021     Current Outpatient Medications on File Prior to Visit  Medication Sig Dispense Refill   acetaminophen  (TYLENOL ) 325 MG tablet Take 325 mg by mouth at bedtime as needed (Sleep). Tylenol  PM.     hydrOXYzine  (ATARAX ) 25 MG tablet TAKE 1 TABLET BY MOUTH AT BEDTIME AS NEEDED 90 tablet 0   traMADol  (ULTRAM ) 50 MG tablet Take 1 tablet (50 mg total) by mouth every 6 (six) hours as needed for moderate pain (pain score 4-6). 10 tablet 0   No current facility-administered medications on file prior to visit.    No Known Allergies  Social History  Socioeconomic History   Marital status: Single    Spouse name: Not on file   Number of children: 2   Years of education: Not on file   Highest education level: Not on file  Occupational History   Occupation: [employer]  Tobacco Use   Smoking status: Never   Smokeless tobacco: Never  Vaping Use   Vaping status: Never Used  Substance and Sexual Activity   Alcohol use: No   Drug use: No   Sexual activity: Not Currently  Other Topics  Concern   Not on file  Social History Narrative   Not on file   Social Drivers of Health   Financial Resource Strain: Low Risk  (08/30/2023)   Overall Financial Resource Strain (CARDIA)    Difficulty of Paying Living Expenses: Not very hard  Food Insecurity: No Food Insecurity (08/30/2023)   Hunger Vital Sign    Worried About Running Out of Food in the Last Year: Never true    Ran Out of Food in the Last Year: Never true  Transportation Needs: No Transportation Needs (08/30/2023)   PRAPARE - Administrator, Civil Service (Medical): No    Lack of Transportation (Non-Medical): No  Physical Activity: Insufficiently Active (08/30/2023)   Exercise Vital Sign    Days of Exercise per Week: 5 days    Minutes of Exercise per Session: 20 min  Stress: No Stress Concern Present (08/30/2023)   [employer] of Occupational Health - Occupational Stress Questionnaire    Feeling of Stress : Not at all  Social Connections: Socially Isolated (08/30/2023)   Social Connection and Isolation Panel    Frequency of Communication with Friends and Family: Twice a week    Frequency of Social Gatherings with Friends and Family: Once a week    Attends Religious Services: Never    Database Administrator or Organizations: No    Attends Banker Meetings: Never    Marital Status: Never married  Intimate Partner Violence: Not At Risk (08/30/2023)   Humiliation, Afraid, Rape, and Kick questionnaire    Fear of Current or Ex-Partner: No    Emotionally Abused: No    Physically Abused: No    Sexually Abused: No    No family history on file.  No past surgical history on file.  ROS: Review of Systems Negative except as stated above  PHYSICAL EXAM: BP 125/86 (BP Location: Left Arm, Patient Position: Standing, Cuff Size: Normal)   Pulse 100   Ht 5' 6 (1.676 m)   Wt 153 lb (69.4 kg)   SpO2 99%   BMI 24.69 kg/m   Wt Readings from Last 3 Encounters:  09/11/24 153 lb (69.4 kg)   08/18/24 160 lb (72.6 kg)  06/20/24 160 lb (72.6 kg)    Sitting: BP 118/81, pulse 89 Standing: BP 125/86, pulse 100 Physical Exam  General appearance - alert, well appearing, elderly AAF and in no distress Mental status - normal mood, behavior, speech, dress, motor activity, and thought processes Mouth: tongue looks lightly dry Chest - clear to auscultation, no wheezes, rales or rhonchi, symmetric air entry Heart - normal rate, regular rhythm, normal S1, S2, no murmurs, rubs, clicks or gallops Neurological - cranial nerves II through XII intact, motor and sensory grossly normal bilaterally, Romberg sign negative, normal gait and station Musculoskeletal - No tenderness on palpation of lumbar spine and surrounding paraspinal muscles. Straight leg raise negative BL. Power LE 4+/5 BL      Latest Ref  Rng & Units 08/23/2024    7:24 AM 08/18/2024    8:37 PM 08/30/2023   10:12 AM  CMP  Glucose 70 - 99 mg/dL 890  891  92   BUN 8 - 23 mg/dL 15  12  10    Creatinine 0.44 - 1.00 mg/dL 8.74  9.04  8.95   Sodium 135 - 145 mmol/L 135  137  142   Potassium 3.5 - 5.1 mmol/L 3.6  3.6  4.0   Chloride 98 - 111 mmol/L 97  102  104   CO2 22 - 32 mmol/L 22  22  19    Calcium 8.9 - 10.3 mg/dL 9.3  9.4  9.7   Total Protein 6.0 - 8.5 g/dL   7.5   Total Bilirubin 0.0 - 1.2 mg/dL   0.3   Alkaline Phos 44 - 121 IU/L   105   AST 0 - 40 IU/L   20   ALT 0 - 32 IU/L   12    Lipid Panel     Component Value Date/Time   CHOL 183 11/30/2022 1012   TRIG 71 11/30/2022 1012   HDL 90 11/30/2022 1012   CHOLHDL 2.0 11/30/2022 1012   LDLCALC 80 11/30/2022 1012    CBC    Component Value Date/Time   WBC 5.5 08/23/2024 0724   RBC 4.64 08/23/2024 0724   HGB 12.4 08/23/2024 0724   HGB 13.1 08/30/2023 1012   HCT 39.3 08/23/2024 0724   HCT 40.3 08/30/2023 1012   PLT 327 08/23/2024 0724   PLT 252 08/30/2023 1012   MCV 84.7 08/23/2024 0724   MCV 85 08/30/2023 1012   MCH 26.7 08/23/2024 0724   MCHC 31.6  08/23/2024 0724   RDW 13.2 08/23/2024 0724   RDW 13.2 08/30/2023 1012   LYMPHSABS 2.5 08/23/2024 0724   MONOABS 0.4 08/23/2024 0724   EOSABS 0.3 08/23/2024 0724   BASOSABS 0.1 08/23/2024 0724    ASSESSMENT AND PLAN: 1. Acute bilateral low back pain without sciatica (Primary) Suspect this is musculoskeletal more so than neurological.  She reports having tried several over-the-counter pain rubs and thinks she has also tried lidocaine  patch.  Will have her stop oral NSAIDs including BC powders given her recent GFR.  Will try her with a tapering dose of prednisone  and refer for some physical therapy.  Advised to avoid any heavy lifting, pushing or pulling over the next 3 weeks. -Use a heating pad to the back twice a day for 10 to 15 minutes. - predniSONE  (DELTASONE ) 20 MG tablet; 1 tab PO daily x 3 days then 1/2 tab PO daily x 6 days  Dispense: 5 tablet; Refill: 0 - Ambulatory referral to Physical Therapy  2. Dizziness Not orthostatic based on BP but pulse increases slightly with standing.  Looking at her most recent creatinine and GFR, she may be mildly dehydrated.  Advise to drink 6-8 bottles of water daily and to go slow with position changes.  Neurologic exam reassuring.  3. Essential hypertension Refill given on amlodipine  - amLODipine  (NORVASC ) 5 MG tablet; Take 1 tablet (5 mg total) by mouth daily.  Dispense: 90 tablet; Refill: 0  4. Ureteral stone Will have her follow-up with urology for this though urinary symptoms have resolved - Ambulatory referral to Urology  5. Renal insufficiency Far usually in the high 50s to greater than 60s.  Recent 1 in the 40s.  I suspect some dehydration and possibly due to recent use of NSAIDs for back pain.  Advised to stop all NSAIDs.  Advised to drink 4-8 12 oz bottles of water daily. - Basic Metabolic Panel    Patient was given the opportunity to ask questions.  Patient verbalized understanding of the plan and was R[place]ger to repeat key elements of  the plan.   This documentation was completed using Paediatric nurse.  Any transcriptional errors are unintentional.  Orders Placed This Encounter  Procedures   Basic Metabolic Panel   Ambulatory referral to Urology   Ambulatory referral to Physical Therapy     Requested Prescriptions   Signed Prescriptions Disp Refills   amLODipine  (NORVASC ) 5 MG tablet 90 tablet 0    Sig: Take 1 tablet (5 mg total) by mouth daily.   predniSONE  (DELTASONE ) 20 MG tablet 5 tablet 0    Sig: 1 tab PO daily x 3 days then 1/2 tab PO daily x 6 days    Return if symptoms worsen or fail to improve.  Barnie Louder, MD, FACP

## 2024-09-11 NOTE — Patient Instructions (Signed)
  VISIT SUMMARY: You came in today for a follow-up after recent ER visits for back pain and urinary tract infections. We discussed your persistent back pain, history of urinary tract infections, and recent symptoms of dizziness.  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 We are referring you to physical therapy for back pain management, prescribing a short course of prednisone , and advising you to avoid ibuprofen, Aleve, Advil, and BC powder to protect your kidney function. Use a heating pad for 10-15 minutes several times a day and avoid heavy lifting, pushing, or pulling for 2-3 weeks.  -NEPHROLITHIASIS, RIGHT URETEROVESICAL JUNCTION: This condition involves a kidney stone located where the ureter meets the bladder. We are referring you to urology for further evaluation and management of the kidney stone.  -DEHYDRATION: Dehydration occurs when your body does not have enough water. Increase your water intake to 6-8 bottles per day and change positions slowly to prevent dizziness.  -DIZZINESS: Your dizziness is likely related to dehydration. Increase your water intake to 6-8 bottles per day and change positions slowly to prevent dizziness.  INSTRUCTIONS: Follow up with physical therapy for back pain management. Follow up with urology for further evaluation and management of the kidney stone. Increase your water intake to 6-8 bottles per day and change positions slowly to prevent dizziness.                      Contains text generated by Abridge.                                 Contains text generated by Abridge.

## 2024-09-12 ENCOUNTER — Ambulatory Visit: Payer: Self-pay | Admitting: Internal Medicine

## 2024-09-12 LAB — BASIC METABOLIC PANEL WITH GFR
BUN/Creatinine Ratio: 11 — ABNORMAL LOW (ref 12–28)
BUN: 11 mg/dL (ref 8–27)
CO2: 22 mmol/L (ref 20–29)
Calcium: 9.7 mg/dL (ref 8.7–10.3)
Chloride: 104 mmol/L (ref 96–106)
Creatinine, Ser: 1.02 mg/dL — ABNORMAL HIGH (ref 0.57–1.00)
Glucose: 86 mg/dL (ref 70–99)
Potassium: 3.9 mmol/L (ref 3.5–5.2)
Sodium: 140 mmol/L (ref 134–144)
eGFR: 58 mL/min/1.73 — ABNORMAL LOW (ref >59)

## 2024-09-20 ENCOUNTER — Telehealth: Payer: Self-pay

## 2024-09-20 NOTE — Telephone Encounter (Signed)
 Copied from CRM [id number]. Topic: Clinical - Medication Question >> Sep 20, 2024  3:40 PM Willma R wrote: Reason for CRM: Patient was prescribed predniSONE  (DELTASONE ) 20 MG tablet on 09/11/24 for back pain. Was provided a referral to ortho but is not scheduled until 09/29/24. Patient says the medication helped ease the pain and is requesting to see if she is [place] to be prescribed more until she can see ortho. (Pain level has remained the same, but the medication reduces it)  Patient can be reached at [phone]

## 2024-09-21 MED ORDER — PREDNISONE 5 MG PO TABS: 5.0000 mg | ORAL_TABLET | Freq: Every day | ORAL | 0 refills | Status: AC

## 2024-09-21 NOTE — Addendum Note (Signed)
 Addended by: VICCI SOBER B on: 09/21/2024 07:35 AM   Modules accepted: Orders

## 2024-09-21 NOTE — Telephone Encounter (Signed)
 Rxn for Prednisone  5 mg for 1 more wk sent to her pharmacy.

## 2024-09-21 NOTE — Telephone Encounter (Signed)
 Noted

## 2024-09-27 NOTE — Therapy (Incomplete)
 OUTPATIENT PHYSICAL THERAPY THORACOLUMBAR EVALUATION   Patient Name: Erika Case MRN: [id number] DOB:[birth year], 73 y.o., female Today's Date: 09/27/2024  END OF SESSION:   Past Medical History:  Diagnosis Date   Hypertension    No past surgical history on file. Patient Active Problem List   Diagnosis Date Noted   CKD (chronic kidney disease) stage 3, GFR 30-59 ml/min (HCC) 12/02/2022   Rheumatoid arthritis involving both hands (HCC) 12/02/2022   Dizziness 07/31/2021   Elevated blood pressure reading in office without diagnosis of hypertension 07/31/2021   Tinnitus of left ear 07/31/2021   Neuropathy 07/31/2021   Insomnia 07/31/2021   Anxiety about health 07/31/2021   Acute pyelonephritis 02/01/2021    PCP: Vicci Barnie NOVAK, MD   REFERRING PROVIDER: Vicci Barnie NOVAK, MD   REFERRING DIAG: M54.50 (ICD-10-CM) - Acute bilateral low back pain without sciatica   Rationale for Evaluation and Treatment: Rehabilitation  THERAPY DIAG:  No diagnosis found.  ONSET DATE: ***  SUBJECTIVE:                                                                                                                                                                                           SUBJECTIVE STATEMENT: ***  PERTINENT HISTORY:  ***  PAIN:  Are you having pain? Yes: NPRS scale: *** Pain location: *** Pain description: *** Aggravating factors: *** Relieving factors: ***  PRECAUTIONS: {Therapy precautions:24002}  RED FLAGS: {PT Red Flags:29287}   WEIGHT BEARING RESTRICTIONS: {Yes ***/No:24003}  FALLS:  Has patient fallen in last 6 months? {fallsyesno:27318}  LIVING ENVIRONMENT: Lives with: {[facility] lives with:25569::lives with their family} Lives in: {Lives in:25570} Stairs: {opstairs:27293} Has following equipment at home: {Assistive devices:23999}  OCCUPATION: ***  PLOF: {PLOF:24004}  PATIENT GOALS: ***  NEXT MD VISIT: ***  OBJECTIVE:  Note: Objective  measures were completed at Evaluation unless otherwise noted.  DIAGNOSTIC FINDINGS:  08/18/24 DG Lumbar spine  IMPRESSION: 1. Lower lumbar facet hypertrophic changes as above. 2. No acute fracture.    PATIENT SURVEYS:  {rehab surveys:24030}  COGNITION: Overall cognitive status: {cognition:24006}     SENSATION: {sensation:27233}  MUSCLE LENGTH: Hamstrings: Right *** deg; Left *** deg Debby test: Right *** deg; Left *** deg  POSTURE: {posture:25561}  PALPATION: ***  LUMBAR ROM:   AROM eval  Flexion   Extension   Right lateral flexion   Left lateral flexion   Right rotation   Left rotation    (Blank rows = not tested)  LOWER EXTREMITY ROM:     {AROM/PROM:27142}  Right eval Left eval  Hip flexion    Hip extension    Hip abduction  Hip adduction    Hip internal rotation    Hip external rotation    Knee flexion    Knee extension    Ankle dorsiflexion    Ankle plantarflexion    Ankle inversion    Ankle eversion     (Blank rows = not tested)  LOWER EXTREMITY MMT:    MMT Right eval Left eval  Hip flexion    Hip extension    Hip abduction    Hip adduction    Hip internal rotation    Hip external rotation    Knee flexion    Knee extension    Ankle dorsiflexion    Ankle plantarflexion    Ankle inversion    Ankle eversion     (Blank rows = not tested)  LUMBAR SPECIAL TESTS:  {lumbar special test:25242}  FUNCTIONAL TESTS:  {Functional tests:24029}  GAIT: Distance walked: *** Assistive device utilized: {Assistive devices:23999} Level of assistance: {Levels of assistance:24026} Comments: ***  TREATMENT DATE:  [facility] Adult PT Treatment:                                                DATE: 09/29/24 Therapeutic Exercise: *** Manual Therapy: *** Neuromuscular re-ed: *** Therapeutic Activity: *** Modalities: *** Self Care: ***                                                                                                                                   PATIENT EDUCATION:  Education details: Eval findings, POC, HEP, self care  Person educated: {Person educated:25204} Education method: {Education Method:25205} Education comprehension: {Education Comprehension:25206}  HOME EXERCISE PROGRAM: ***  ASSESSMENT:  CLINICAL IMPRESSION: Patient is a 73 y.o. female who was seen today for physical therapy evaluation and treatment for M54.50 (ICD-10-CM) - Acute bilateral low back pain without sciatica .   OBJECTIVE IMPAIRMENTS: {opptimpairments:25111}.   ACTIVITY LIMITATIONS: {activitylimitations:27494}  PARTICIPATION LIMITATIONS: {participationrestrictions:25113}  PERSONAL FACTORS: {Personal factors:25162} are also affecting patient's functional outcome.   REHAB POTENTIAL: {rehabpotential:25112}  CLINICAL DECISION MAKING: {clinical decision making:25114}  EVALUATION COMPLEXITY: {Evaluation complexity:25115}   GOALS:  SHORT TERM GOALS: Target date: ***  Pt will be Ind in an initial HEP  Baseline:started Goal status: INITIAL  2.  *** Baseline:  Goal status: INITIAL  3.  *** Baseline:  Goal status: INITIAL  4.  *** Baseline:  Goal status: INITIAL  5.  *** Baseline:  Goal status: INITIAL  6.  *** Baseline:  Goal status: INITIAL  LONG TERM GOALS: Target date: ***  Pt will be Ind in a final HEP to maintain achieved LOF  Baseline: started Goal status: INITIAL  2.  *** Baseline:  Goal status: INITIAL  3.  *** Baseline:  Goal status: INITIAL  4.  *** Baseline:  Goal status: INITIAL  5.  ***  Baseline:  Goal status: INITIAL  6.  *** Baseline:  Goal status: INITIAL  PLAN:  PT FREQUENCY: {rehab frequency:25116}  PT DURATION: {rehab duration:25117}  PLANNED INTERVENTIONS: {rehab planned interventions:25118::97110-Therapeutic exercises,97530- Therapeutic [phone]- Neuromuscular re-education,97535- Self Rjmz,02859- Manual therapy,Patient/Family education}.  PLAN  FOR NEXT SESSION: ***   Dasie Daft, PT 09/27/2024, 7:47 AM

## 2024-09-29 ENCOUNTER — Ambulatory Visit: Attending: Internal Medicine

## 2024-10-02 ENCOUNTER — Other Ambulatory Visit: Payer: Self-pay | Admitting: Internal Medicine

## 2024-10-02 NOTE — Telephone Encounter (Unsigned)
 Copied from CRM #[id number]. Topic: Clinical - Medication Refill >> Oct 02, 2024  4:47 PM Teressa P wrote: Medication: Deltasone  5 mg  still having pain  Has the patient contacted their pharmacy? No (Agent: If no, request that the patient contact the pharmacy for the refill. If patient does not wish to contact the pharmacy document the reason why and proceed with request.) (Agent: If yes, when and what did the pharmacy advise?)  This is the patient's preferred pharmacy:  [facility] [address] ([address] [place] - [address] [place]) [place] [address] Phone: [phone] Fax: [phone]   Is this the correct pharmacy for this prescription? Yes If no, delete pharmacy and type the correct one.   Has the prescription been filled recently? Yes  Is the patient out of the medication? Yes  Has the patient been seen for an appointment in the last year OR does the patient have an upcoming appointment? Yes  Can we respond through MyChart? No  Agent: Please be advised that Rx refills may take up to 3 business days. We ask that you follow-up with your pharmacy.

## 2024-10-05 NOTE — Telephone Encounter (Signed)
 Requested medication (s) are due for refill today: no  Requested medication (s) are on the active medication list: yes  Last refill:  09/21/24  Future visit scheduled: yes  Notes to clinic:  Not delegated.    Requested Prescriptions  Pending Prescriptions Disp Refills   predniSONE  (DELTASONE ) 5 MG tablet 7 tablet 0    Sig: Take 1 tablet (5 mg total) by mouth daily with breakfast.     Not Delegated - Endocrinology:  Oral Corticosteroids Failed - 10/05/2024  1:01 PM      Failed - This refill cannot be delegated      Failed - Manual Review: Eye exam for IOP if prolonged treatment      Failed - Bone Mineral Density or Dexa Scan completed in the last 2 years      Passed - Glucose (serum) in normal range and within 180 days    Glucose  Date Value Ref Range Status  09/11/2024 86 70 - 99 mg/dL Final   Glucose, Bld  Date Value Ref Range Status  08/23/2024 109 (H) 70 - 99 mg/dL Final    Comment:    Glucose reference range applies only to samples taken after fasting for at least 8 hours.   Glucose-Capillary  Date Value Ref Range Status  10/22/2021 78 70 - 99 mg/dL Final    Comment:    Glucose reference range applies only to samples taken after fasting for at least 8 hours.         Passed - K in normal range and within 180 days    Potassium  Date Value Ref Range Status  09/11/2024 3.9 3.5 - 5.2 mmol/L Final         Passed - Na in normal range and within 180 days    Sodium  Date Value Ref Range Status  09/11/2024 140 134 - 144 mmol/L Final         Passed - Last BP in normal range    BP Readings from Last 1 Encounters:  09/11/24 125/86         Passed - Valid encounter within last 6 months    Recent Outpatient Visits           3 weeks ago Acute bilateral low back pain without sciatica   [facility] [place] Vicci Barnie NOVAK, MD   3 months ago Abnormal urine odor   [facility] Of  [place] Vicci Barnie NOVAK, MD   9 months ago Essential hypertension   [facility] Dept Of [place]. [facility] Vicci Barnie NOVAK, MD   1 year ago Rheumatoid arthritis involving both hands with positive rheumatoid factor (HCC)   [facility] Of [place]. [facility] Vicci Barnie NOVAK, MD   1 year ago Essential hypertension   [facility] Dept Of [place] Vicci Barnie NOVAK, MD

## 2024-10-13 ENCOUNTER — Other Ambulatory Visit: Payer: Self-pay | Admitting: Internal Medicine

## 2024-10-13 ENCOUNTER — Telehealth: Payer: Self-pay | Admitting: Internal Medicine

## 2024-10-13 DIAGNOSIS — M545 Low back pain, unspecified: Secondary | ICD-10-CM

## 2024-10-16 ENCOUNTER — Ambulatory Visit: Payer: Self-pay

## 2024-10-16 NOTE — Telephone Encounter (Signed)
 FYI Only or Action Required?: Action required by provider: clinical question for provider, update on patient condition, and Patient requesting medication for pain until she can see the back specialist on the 19th of January.  Patient was last seen in primary care on 09/11/2024 by Vicci Barnie NOVAK, MD.  Called Nurse Triage reporting Back Pain.  Symptoms began a month ago, got better while on Prednisone  and then started back hurting a week or two later per patient.  Interventions attempted: OTC medications: Tylenol , Biofreeze, Prescription medications: Prednisone , Rest, hydration, or home remedies, and Ice/heat application.  Symptoms are: gradually worsening.  Triage Disposition: See PCP When Office is Open (Within 3 Days)  Patient/caregiver understands and will follow disposition?: No, wishes to speak with PCP                 Copied from CRM #[id number]. Topic: Clinical - Red Word Triage >> Oct 16, 2024  2:19 PM Larissa S wrote: [facility] that prompted transfer to Nurse Triage: back pain- worsening Reason for Disposition  [1] MODERATE back pain (e.g., interferes with normal activities) AND [2] present > 3 days  Answer Assessment - Initial Assessment Questions Patient told her PCP at her last visit 09/11/2024 about her lower back pain. Patient was prescribed Prednisone  and she states she is out of that now.  Pain is non-radiating and she denies urinary/bowel changes, vomiting, new injuries, abdominal pain.  She states that the Prednisone  helped her back but when she states her back started hurting again about a week or two after finishing that Prednisone . Patient states she has an appointment with a back specialist but that appointment is not until the 19th of January. Back pain for a month. Patient states that she thinks possibly strained her back when bringing house plants inside a month ago. Patient has used a heating pad and is using a back brace. 6 out of 10 on pain scale.  Patient has taken Tylenol , Biofreeze--nothing helps.  Patient states that she doesn't want to come in for an appointment---she just wants to see if her PCP Dr Barnie Vicci would prescribe anything else for her pain until she can see the back specialist on the 19th of January.   Patient is advised to call us  back if anything changes and if anything worsens to go to the Emergency Room.  Patient verbalized understanding.  Protocols used: Back Pain-A-AH

## 2024-10-17 ENCOUNTER — Ambulatory Visit

## 2024-10-17 NOTE — Telephone Encounter (Signed)
 Let pt know that I referred her for physical therapy and she no showed the appt 09/29/24 and looks like appt for 11/06/2024 was cancelled.  Can not prescribed any further medication if she will not follow through with referrals. Recommend Lidocaine  Patch OTC. Give f/u office appt with me for eval for need for MRI.

## 2024-10-18 NOTE — Telephone Encounter (Signed)
 Called but no answer. LVM to call back.

## 2024-10-20 ENCOUNTER — Telehealth: Payer: Self-pay | Admitting: Internal Medicine

## 2024-10-20 NOTE — Telephone Encounter (Signed)
 Called back but no answer. Please see previous telephone encounter for more information.

## 2024-10-20 NOTE — Telephone Encounter (Signed)
 Copied from CRM [id number]. Topic: General - Other >> Oct 20, 2024  2:33 PM Myrick T wrote:  Reason for CRM: patient returning Clarisa's call. Please f/u with patient

## 2024-10-20 NOTE — Telephone Encounter (Signed)
 Called but no answer. LVM to call back.

## 2024-10-20 NOTE — Telephone Encounter (Unsigned)
 Copied from CRM [id number]. Topic: General - Other >> Oct 20, 2024 11:55 AM Tiffini S wrote: Reason for CRM: Patient call back stating that she had a missed call -   Called CAL, Please call the patient back at [phone]

## 2024-10-23 ENCOUNTER — Telehealth: Payer: Self-pay | Admitting: Internal Medicine

## 2024-10-23 NOTE — Telephone Encounter (Signed)
 Patient said she was called but no message was left. Advised per CAL she can disregard the message as patient did not want Dr Vicci to be her provider any longer.

## 2024-10-23 NOTE — Telephone Encounter (Signed)
 Third attempt. Called but no answer. LVM to call back. Give f/u office appt with Dr.Johnson for eval for need for MRI. Please see Dr.Johnson's message below for more information.

## 2024-10-23 NOTE — Telephone Encounter (Signed)
Sending as a Financial planner

## 2024-10-23 NOTE — Telephone Encounter (Signed)
 Copied from CRM [id number]. Topic: General - Other >> Oct 23, 2024  1:35 PM Nathanel BROCKS wrote:  Reason for CRM: pt doesn't want Dr Vicci as her PCP anymore and doesn't want anymore call from us .

## 2024-10-23 NOTE — Telephone Encounter (Signed)
 Copied from CRM [id number]. Topic: Clinical - Medical Advice >> Oct 23, 2024  1:33 PM Nathanel BROCKS wrote:  Reason for CRM: pt called back and stated that she doesn't need an mri she's done had one at [facility] and she doesn't want another one.. See note below where nurse tried to call her back today.   Alarcon, Clarisa N, CMA  Certified Medical Assistant   Telephone Encounter Signed   Encounter Date: 10/16/2024  Signed    Third attempt. Called but no answer. LVM to call back. Give f/u office appt with Dr.Johnson for eval for need for MRI. Please see Dr.Johnson's message below for more information.

## 2024-10-23 NOTE — Telephone Encounter (Addendum)
 Sending as a FYI.  I will remove Dr. Vicci as the patients PCP, per patient request.

## 2024-10-30 ENCOUNTER — Telehealth: Payer: Self-pay | Admitting: General Practice

## 2024-10-30 ENCOUNTER — Ambulatory Visit: Payer: Self-pay

## 2024-10-30 NOTE — Telephone Encounter (Signed)
 Noted

## 2024-10-30 NOTE — Telephone Encounter (Signed)
 Copied from CRM #[id number]. Topic: Clinical - Refused Triage >> Oct 30, 2024  1:15 PM Willma R wrote: Patient/caller voiced complaints of Back Pain. Declined transfer to triage.  (Patient was attempted to be sent to NT this morning and the call disconnected. Did advise she doesn't have a PCP per her request on 10/23/24 and would need to establish with another physician)

## 2024-10-30 NOTE — Telephone Encounter (Signed)
 Copied from CRM [id number]. Topic: Clinical - Red Word Triage >> Oct 30, 2024  9:39 AM Montie POUR wrote: Red Word that prompted transfer to Nurse Triage:  Acute bilateral low back pain without sciatica - Pain level at a 7 She want an appointment and see if she can get medication.  Call disconnected. Called back, no answer.

## 2024-10-30 NOTE — Telephone Encounter (Signed)
 FYI Only or Action Required?: Action required by provider: update on patient condition.  Patient was last seen in primary care on 09/11/2024 by Vicci Barnie NOVAK, MD.  Called Nurse Triage reporting Back Pain.  Symptoms began chronic, would not specify.  Interventions attempted: Prescription medications: prednisone , lidocaine  patch.  Symptoms are: unchanged or worsening.  Triage Disposition: See PCP When Office is Open (Within 3 Days)  Patient/caregiver understands and will follow disposition?:   Reason for Disposition  [1] MODERATE back pain (e.g., interferes with normal activities) AND [2] present > 3 days  Answer Assessment - Initial Assessment Questions Note: pt appears to no longer a pt of Dr. Vicci but wants a message sent to her.   Pt reports chronic back pain. States was prescribed prednisone  previously with improvement. Has tried OTC medication without improvement. Has appt with back specialist scheduled. Pt states not using lidocaine  patches or PT. When asked what she is taking OTC, she said everything. When asked to clarify, she said nothing.   When advised that previous PCP noted that she needs an appointment for prescriptions, she began yelling at this RN. States she has an appointment on the 19th with a specialist, unable to clarify if that is with PT or a back doctor but that it [place] the fire station.   Pt was agreeable to setting up a TOC appt at [facility] at [facility] as it is close to her home and she has to take public transportation.  1. ONSET: When did the pain begin? (e.g., minutes, hours, days)     Awhile, if you see my chart 2. LOCATION: Where does it hurt? (upper, mid or lower back)     Lower back 3. SEVERITY: How bad is the pain?  (e.g., Scale 1-10; mild, moderate, or severe)     5/10 4. PATTERN: Is the pain constant? (e.g., yes, no; constant, intermittent)     When laying down or when moving 5. RADIATION: Does the pain shoot into your  legs or somewhere else?     Denies  Protocols used: Back Pain-A-AH

## 2024-11-06 ENCOUNTER — Ambulatory Visit: Admitting: Physical Therapy

## 2024-12-11 ENCOUNTER — Encounter: Payer: Self-pay | Admitting: Family
